# Patient Record
Sex: Female | Born: 1935 | Race: White | Hispanic: No
Health system: Southern US, Community
[De-identification: ages and names within clinical notes are randomized; demographics above are authoritative.]

## PROBLEM LIST (undated history)

## (undated) DIAGNOSIS — I10 Essential (primary) hypertension: Secondary | ICD-10-CM

## (undated) DIAGNOSIS — G309 Alzheimer's disease, unspecified: Secondary | ICD-10-CM

## (undated) DIAGNOSIS — F0280 Dementia in other diseases classified elsewhere without behavioral disturbance: Secondary | ICD-10-CM

## (undated) DIAGNOSIS — H409 Unspecified glaucoma: Secondary | ICD-10-CM

## (undated) DIAGNOSIS — F419 Anxiety disorder, unspecified: Secondary | ICD-10-CM

## (undated) DIAGNOSIS — E785 Hyperlipidemia, unspecified: Secondary | ICD-10-CM

---

## 2005-11-02 ENCOUNTER — Ambulatory Visit: Payer: Self-pay | Admitting: Internal Medicine

## 2006-11-23 ENCOUNTER — Ambulatory Visit: Payer: Self-pay | Admitting: Internal Medicine

## 2007-03-30 ENCOUNTER — Ambulatory Visit: Payer: Self-pay | Admitting: Internal Medicine

## 2007-07-28 ENCOUNTER — Ambulatory Visit: Payer: Self-pay | Admitting: Internal Medicine

## 2008-01-17 ENCOUNTER — Ambulatory Visit: Payer: Self-pay | Admitting: Internal Medicine

## 2008-10-17 ENCOUNTER — Inpatient Hospital Stay: Payer: Self-pay | Admitting: Internal Medicine

## 2010-04-16 ENCOUNTER — Ambulatory Visit: Payer: Self-pay | Admitting: Internal Medicine

## 2010-05-09 ENCOUNTER — Inpatient Hospital Stay: Payer: Self-pay | Admitting: Internal Medicine

## 2010-05-13 ENCOUNTER — Inpatient Hospital Stay: Payer: Self-pay | Admitting: Internal Medicine

## 2010-05-16 ENCOUNTER — Ambulatory Visit: Payer: Self-pay | Admitting: Internal Medicine

## 2010-06-16 ENCOUNTER — Ambulatory Visit: Payer: Self-pay | Admitting: Internal Medicine

## 2012-07-23 ENCOUNTER — Emergency Department: Payer: Self-pay | Admitting: Unknown Physician Specialty

## 2013-01-02 ENCOUNTER — Emergency Department: Payer: Self-pay | Admitting: Emergency Medicine

## 2013-02-13 ENCOUNTER — Emergency Department: Payer: Self-pay | Admitting: Emergency Medicine

## 2013-02-13 LAB — COMPREHENSIVE METABOLIC PANEL
Albumin: 3.5 g/dL (ref 3.4–5.0)
Bilirubin,Total: 0.4 mg/dL (ref 0.2–1.0)
Calcium, Total: 9 mg/dL (ref 8.5–10.1)
Chloride: 108 mmol/L — ABNORMAL HIGH (ref 98–107)
Co2: 28 mmol/L (ref 21–32)
EGFR (African American): 39 — ABNORMAL LOW
EGFR (Non-African Amer.): 34 — ABNORMAL LOW
Glucose: 88 mg/dL (ref 65–99)
Osmolality: 288 (ref 275–301)
SGOT(AST): 27 U/L (ref 15–37)
SGPT (ALT): 18 U/L (ref 12–78)
Sodium: 142 mmol/L (ref 136–145)

## 2013-02-13 LAB — CBC WITH DIFFERENTIAL/PLATELET
Basophil %: 0.7 %
HCT: 34.5 % — ABNORMAL LOW (ref 35.0–47.0)
Lymphocyte #: 1.5 10*3/uL (ref 1.0–3.6)
Lymphocyte %: 20.4 %
MCH: 31.7 pg (ref 26.0–34.0)
MCHC: 33.3 g/dL (ref 32.0–36.0)
Monocyte %: 12.5 %
Neutrophil #: 4.6 10*3/uL (ref 1.4–6.5)
Neutrophil %: 63.8 %
Platelet: 191 10*3/uL (ref 150–440)
RDW: 13.3 % (ref 11.5–14.5)

## 2013-02-13 LAB — URINALYSIS, COMPLETE
Bilirubin,UR: NEGATIVE
Blood: NEGATIVE
Glucose,UR: NEGATIVE mg/dL (ref 0–75)
Hyaline Cast: 3
Ketone: NEGATIVE
Nitrite: NEGATIVE
RBC,UR: 2 /HPF (ref 0–5)
WBC UR: 6 /HPF (ref 0–5)

## 2013-02-13 LAB — TROPONIN I: Troponin-I: <0.02 ng/mL

## 2013-03-05 LAB — URINALYSIS, COMPLETE
Bilirubin,UR: NEGATIVE
Glucose,UR: NEGATIVE mg/dL (ref 0–75)
Ketone: NEGATIVE
Leukocyte Esterase: NEGATIVE
Nitrite: NEGATIVE
Ph: 6 (ref 4.5–8.0)
Squamous Epithelial: <1

## 2013-03-05 LAB — CBC
HGB: 11.2 g/dL — ABNORMAL LOW (ref 12.0–16.0)
MCHC: 33.4 g/dL (ref 32.0–36.0)
MCV: 96 fL (ref 80–100)
Platelet: 177 10*3/uL (ref 150–440)
RDW: 13.4 % (ref 11.5–14.5)
WBC: 8.3 10*3/uL (ref 3.6–11.0)

## 2013-03-05 LAB — TROPONIN I
Troponin-I: <0.02 ng/mL
Troponin-I: <0.02 ng/mL

## 2013-03-05 LAB — COMPREHENSIVE METABOLIC PANEL
Albumin: 3.3 g/dL — ABNORMAL LOW (ref 3.4–5.0)
Alkaline Phosphatase: 96 U/L (ref 50–136)
Anion Gap: 5 — ABNORMAL LOW (ref 7–16)
Bilirubin,Total: 0.6 mg/dL (ref 0.2–1.0)
Calcium, Total: 8.7 mg/dL (ref 8.5–10.1)
Creatinine: 1.34 mg/dL — ABNORMAL HIGH (ref 0.60–1.30)
Osmolality: 280 (ref 275–301)
Potassium: 4.7 mmol/L (ref 3.5–5.1)
SGOT(AST): 44 U/L — ABNORMAL HIGH (ref 15–37)
SGPT (ALT): 20 U/L (ref 12–78)
Sodium: 138 mmol/L (ref 136–145)
Total Protein: 7 g/dL (ref 6.4–8.2)

## 2013-03-05 LAB — AMMONIA: Ammonia, Plasma: <25 mcmol/L (ref 11–32)

## 2013-03-06 ENCOUNTER — Inpatient Hospital Stay: Payer: Self-pay

## 2013-03-06 LAB — CBC WITH DIFFERENTIAL/PLATELET
Eosinophil #: 0.1 10*3/uL (ref 0.0–0.7)
Eosinophil %: 1.8 %
HCT: 33.4 % — ABNORMAL LOW (ref 35.0–47.0)
HGB: 11.2 g/dL — ABNORMAL LOW (ref 12.0–16.0)
Lymphocyte #: 0.8 10*3/uL — ABNORMAL LOW (ref 1.0–3.6)
Lymphocyte %: 11.7 %
MCH: 32.1 pg (ref 26.0–34.0)
MCHC: 33.6 g/dL (ref 32.0–36.0)
MCV: 96 fL (ref 80–100)
Monocyte #: 1 x10 3/mm — ABNORMAL HIGH (ref 0.2–0.9)
Neutrophil #: 5.3 10*3/uL (ref 1.4–6.5)
Platelet: 158 10*3/uL (ref 150–440)

## 2013-03-06 LAB — COMPREHENSIVE METABOLIC PANEL
Albumin: 3.2 g/dL — ABNORMAL LOW (ref 3.4–5.0)
Alkaline Phosphatase: 94 U/L (ref 50–136)
Bilirubin,Total: 0.4 mg/dL (ref 0.2–1.0)
Calcium, Total: 8.7 mg/dL (ref 8.5–10.1)
Chloride: 110 mmol/L — ABNORMAL HIGH (ref 98–107)
Co2: 24 mmol/L (ref 21–32)
Creatinine: 0.94 mg/dL (ref 0.60–1.30)
EGFR (African American): 59 — ABNORMAL LOW
EGFR (Non-African Amer.): 51 — ABNORMAL LOW
Glucose: 77 mg/dL (ref 65–99)
SGOT(AST): 50 U/L — ABNORMAL HIGH (ref 15–37)
SGPT (ALT): 25 U/L (ref 12–78)

## 2013-03-07 LAB — URINE CULTURE

## 2013-03-07 LAB — BASIC METABOLIC PANEL
BUN: 19 mg/dL — ABNORMAL HIGH (ref 7–18)
Co2: 23 mmol/L (ref 21–32)
Creatinine: 0.98 mg/dL (ref 0.60–1.30)
EGFR (African American): 56 — ABNORMAL LOW
EGFR (Non-African Amer.): 49 — ABNORMAL LOW
Glucose: 94 mg/dL (ref 65–99)
Osmolality: 285 (ref 275–301)
Potassium: 3.7 mmol/L (ref 3.5–5.1)

## 2013-03-11 LAB — CULTURE, BLOOD (SINGLE)

## 2014-06-16 ENCOUNTER — Ambulatory Visit: Payer: Self-pay | Admitting: Internal Medicine

## 2014-07-04 ENCOUNTER — Inpatient Hospital Stay: Payer: Self-pay | Admitting: Internal Medicine

## 2014-07-04 LAB — BASIC METABOLIC PANEL
ANION GAP: 11 (ref 7–16)
BUN: 20 mg/dL — AB (ref 7–18)
CALCIUM: 9 mg/dL (ref 8.5–10.1)
CHLORIDE: 98 mmol/L (ref 98–107)
CREATININE: 1.59 mg/dL — AB (ref 0.60–1.30)
Co2: 21 mmol/L (ref 21–32)
EGFR (African American): 31 — ABNORMAL LOW
EGFR (Non-African Amer.): 27 — ABNORMAL LOW
Glucose: 82 mg/dL (ref 65–99)
OSMOLALITY: 262 (ref 275–301)
Potassium: 4.9 mmol/L (ref 3.5–5.1)
Sodium: 130 mmol/L — ABNORMAL LOW (ref 136–145)

## 2014-07-04 LAB — CBC WITH DIFFERENTIAL/PLATELET
BASOS PCT: 0.9 %
Basophil #: 0.1 10*3/uL (ref 0.0–0.1)
EOS PCT: 3.4 %
Eosinophil #: 0.2 10*3/uL (ref 0.0–0.7)
HCT: 37 % (ref 35.0–47.0)
HGB: 12 g/dL (ref 12.0–16.0)
Lymphocyte #: 1.5 10*3/uL (ref 1.0–3.6)
Lymphocyte %: 23 %
MCH: 32.4 pg (ref 26.0–34.0)
MCHC: 32.5 g/dL (ref 32.0–36.0)
MCV: 100 fL (ref 80–100)
MONO ABS: 0.7 x10 3/mm (ref 0.2–0.9)
Monocyte %: 10.9 %
Neutrophil #: 3.9 10*3/uL (ref 1.4–6.5)
Neutrophil %: 61.8 %
Platelet: 202 10*3/uL (ref 150–440)
RBC: 3.7 10*6/uL — ABNORMAL LOW (ref 3.80–5.20)
RDW: 13 % (ref 11.5–14.5)
WBC: 6.3 10*3/uL (ref 3.6–11.0)

## 2014-07-04 LAB — URINALYSIS, COMPLETE
BLOOD: NEGATIVE
Bilirubin,UR: NEGATIVE
Glucose,UR: NEGATIVE mg/dL (ref 0–75)
KETONE: NEGATIVE
LEUKOCYTE ESTERASE: NEGATIVE
Nitrite: NEGATIVE
PH: 6 (ref 4.5–8.0)
PROTEIN: NEGATIVE
Specific Gravity: 1.005 (ref 1.003–1.030)
WBC UR: 7 /HPF (ref 0–5)

## 2014-07-04 LAB — TROPONIN I: Troponin-I: <0.02 ng/mL

## 2014-07-05 LAB — CBC WITH DIFFERENTIAL/PLATELET
Basophil #: 0.1 10*3/uL (ref 0.0–0.1)
Basophil %: 0.9 %
Eosinophil #: 0.2 10*3/uL (ref 0.0–0.7)
Eosinophil %: 2.9 %
HCT: 33.7 % — ABNORMAL LOW (ref 35.0–47.0)
HGB: 11.1 g/dL — ABNORMAL LOW (ref 12.0–16.0)
Lymphocyte #: 1.1 10*3/uL (ref 1.0–3.6)
Lymphocyte %: 17.5 %
MCH: 32.7 pg (ref 26.0–34.0)
MCHC: 32.8 g/dL (ref 32.0–36.0)
MCV: 100 fL (ref 80–100)
Monocyte #: 0.6 x10 3/mm (ref 0.2–0.9)
Monocyte %: 9.2 %
NEUTROS PCT: 69.5 %
Neutrophil #: 4.2 10*3/uL (ref 1.4–6.5)
Platelet: 203 10*3/uL (ref 150–440)
RBC: 3.38 10*6/uL — ABNORMAL LOW (ref 3.80–5.20)
RDW: 12.6 % (ref 11.5–14.5)
WBC: 6 10*3/uL (ref 3.6–11.0)

## 2014-07-05 LAB — BASIC METABOLIC PANEL
ANION GAP: 9 (ref 7–16)
BUN: 17 mg/dL (ref 7–18)
CALCIUM: 8.5 mg/dL (ref 8.5–10.1)
Chloride: 106 mmol/L (ref 98–107)
Co2: 23 mmol/L (ref 21–32)
Creatinine: 1.28 mg/dL (ref 0.60–1.30)
EGFR (Non-African Amer.): 35 — ABNORMAL LOW
GFR CALC AF AMER: 40 — AB
GLUCOSE: 75 mg/dL (ref 65–99)
OSMOLALITY: 276 (ref 275–301)
Potassium: 4 mmol/L (ref 3.5–5.1)
Sodium: 138 mmol/L (ref 136–145)

## 2014-07-07 LAB — URINE CULTURE

## 2014-07-08 ENCOUNTER — Inpatient Hospital Stay: Payer: Self-pay | Admitting: Internal Medicine

## 2014-07-08 LAB — URINALYSIS, COMPLETE
Bacteria: NONE SEEN
Bilirubin,UR: NEGATIVE
Blood: NEGATIVE
Glucose,UR: NEGATIVE mg/dL (ref 0–75)
Ketone: NEGATIVE
Leukocyte Esterase: NEGATIVE
Nitrite: NEGATIVE
Ph: 6 (ref 4.5–8.0)
Protein: 30
RBC, UR: NONE SEEN /HPF (ref 0–5)
SPECIFIC GRAVITY: 1.005 (ref 1.003–1.030)
Squamous Epithelial: 1
WBC UR: 2 /HPF (ref 0–5)

## 2014-07-08 LAB — COMPREHENSIVE METABOLIC PANEL
ANION GAP: 10 (ref 7–16)
AST: 23 U/L (ref 15–37)
Albumin: 2.9 g/dL — ABNORMAL LOW (ref 3.4–5.0)
Alkaline Phosphatase: 71 U/L
BILIRUBIN TOTAL: 0.3 mg/dL (ref 0.2–1.0)
BUN: 16 mg/dL (ref 7–18)
CALCIUM: 7.5 mg/dL — AB (ref 8.5–10.1)
CHLORIDE: 112 mmol/L — AB (ref 98–107)
Co2: 20 mmol/L — ABNORMAL LOW (ref 21–32)
Creatinine: 1.58 mg/dL — ABNORMAL HIGH (ref 0.60–1.30)
EGFR (African American): 31 — ABNORMAL LOW
EGFR (Non-African Amer.): 27 — ABNORMAL LOW
Glucose: 108 mg/dL — ABNORMAL HIGH (ref 65–99)
OSMOLALITY: 285 (ref 275–301)
Potassium: 3.5 mmol/L (ref 3.5–5.1)
SGPT (ALT): 17 U/L
SODIUM: 142 mmol/L (ref 136–145)
TOTAL PROTEIN: 5.6 g/dL — AB (ref 6.4–8.2)

## 2014-07-08 LAB — TROPONIN I: TROPONIN-I: 0.03 ng/mL

## 2014-07-08 LAB — CBC
HCT: 34.1 % — ABNORMAL LOW (ref 35.0–47.0)
HGB: 11.3 g/dL — AB (ref 12.0–16.0)
MCH: 33.2 pg (ref 26.0–34.0)
MCHC: 33.1 g/dL (ref 32.0–36.0)
MCV: 100 fL (ref 80–100)
Platelet: 250 10*3/uL (ref 150–440)
RBC: 3.4 10*6/uL — AB (ref 3.80–5.20)
RDW: 13.1 % (ref 11.5–14.5)
WBC: 10.5 10*3/uL (ref 3.6–11.0)

## 2014-07-09 LAB — CBC WITH DIFFERENTIAL/PLATELET
Basophil #: 0.1 10*3/uL (ref 0.0–0.1)
Basophil %: 0.6 %
Eosinophil #: 0.3 10*3/uL (ref 0.0–0.7)
Eosinophil %: 2.5 %
HCT: 31 % — AB (ref 35.0–47.0)
HGB: 10.1 g/dL — AB (ref 12.0–16.0)
LYMPHS PCT: 10.4 %
Lymphocyte #: 1.1 10*3/uL (ref 1.0–3.6)
MCH: 32.6 pg (ref 26.0–34.0)
MCHC: 32.6 g/dL (ref 32.0–36.0)
MCV: 100 fL (ref 80–100)
Monocyte #: 0.7 x10 3/mm (ref 0.2–0.9)
Monocyte %: 6.8 %
NEUTROS ABS: 8.7 10*3/uL — AB (ref 1.4–6.5)
Neutrophil %: 79.7 %
Platelet: 166 10*3/uL (ref 150–440)
RBC: 3.1 10*6/uL — ABNORMAL LOW (ref 3.80–5.20)
RDW: 13.2 % (ref 11.5–14.5)
WBC: 10.9 10*3/uL (ref 3.6–11.0)

## 2014-07-09 LAB — BASIC METABOLIC PANEL
Anion Gap: 9 (ref 7–16)
BUN: 15 mg/dL (ref 7–18)
CREATININE: 0.97 mg/dL (ref 0.60–1.30)
Calcium, Total: 8.1 mg/dL — ABNORMAL LOW (ref 8.5–10.1)
Chloride: 114 mmol/L — ABNORMAL HIGH (ref 98–107)
Co2: 21 mmol/L (ref 21–32)
EGFR (African American): 56 — ABNORMAL LOW
GFR CALC NON AF AMER: 49 — AB
Glucose: 93 mg/dL (ref 65–99)
Osmolality: 287 (ref 275–301)
Potassium: 3.4 mmol/L — ABNORMAL LOW (ref 3.5–5.1)
Sodium: 144 mmol/L (ref 136–145)

## 2014-07-09 LAB — CULTURE, BLOOD (SINGLE)

## 2014-07-09 LAB — MAGNESIUM: MAGNESIUM: 1 mg/dL — AB

## 2014-07-10 LAB — MAGNESIUM: Magnesium: 2.4 mg/dL

## 2014-07-13 LAB — CULTURE, BLOOD (SINGLE)

## 2014-07-17 ENCOUNTER — Ambulatory Visit: Payer: Self-pay | Admitting: Internal Medicine

## 2014-07-25 ENCOUNTER — Emergency Department: Payer: Self-pay | Admitting: Emergency Medicine

## 2014-09-25 ENCOUNTER — Emergency Department: Payer: Self-pay | Admitting: Emergency Medicine

## 2014-09-25 LAB — CBC
HCT: 30.9 % — AB (ref 35.0–47.0)
HGB: 10.2 g/dL — AB (ref 12.0–16.0)
MCH: 32.4 pg (ref 26.0–34.0)
MCHC: 32.9 g/dL (ref 32.0–36.0)
MCV: 99 fL (ref 80–100)
Platelet: 194 10*3/uL (ref 150–440)
RBC: 3.14 10*6/uL — ABNORMAL LOW (ref 3.80–5.20)
RDW: 13.2 % (ref 11.5–14.5)
WBC: 7.6 10*3/uL (ref 3.6–11.0)

## 2014-09-25 LAB — COMPREHENSIVE METABOLIC PANEL
ALT: 14 U/L
ANION GAP: 7 (ref 7–16)
Albumin: 3.2 g/dL — ABNORMAL LOW (ref 3.4–5.0)
Alkaline Phosphatase: 97 U/L
BUN: 31 mg/dL — AB (ref 7–18)
Bilirubin,Total: 0.4 mg/dL (ref 0.2–1.0)
CO2: 28 mmol/L (ref 21–32)
Calcium, Total: 8.5 mg/dL (ref 8.5–10.1)
Chloride: 103 mmol/L (ref 98–107)
Creatinine: 1.38 mg/dL — ABNORMAL HIGH (ref 0.60–1.30)
EGFR (African American): 45 — ABNORMAL LOW
EGFR (Non-African Amer.): 38 — ABNORMAL LOW
Glucose: 86 mg/dL (ref 65–99)
Osmolality: 282 (ref 275–301)
Potassium: 4.5 mmol/L (ref 3.5–5.1)
SGOT(AST): 16 U/L (ref 15–37)
Sodium: 138 mmol/L (ref 136–145)
Total Protein: 6.4 g/dL (ref 6.4–8.2)

## 2014-09-25 LAB — URINALYSIS, COMPLETE
Bacteria: NONE SEEN
Bilirubin,UR: NEGATIVE
Blood: NEGATIVE
GLUCOSE, UR: NEGATIVE mg/dL (ref 0–75)
Leukocyte Esterase: NEGATIVE
NITRITE: NEGATIVE
Ph: 5 (ref 4.5–8.0)
RBC,UR: <1 /HPF (ref 0–5)
Specific Gravity: 1.017 (ref 1.003–1.030)
WBC UR: 1 /HPF (ref 0–5)

## 2014-09-25 LAB — TROPONIN I

## 2015-03-08 NOTE — Discharge Summary (Signed)
PATIENT NAME:  [name] [name], [name] [name] MR#:  [id number] DATE OF BIRTH:  17-Jul-1914  DATE OF ADMISSION:  03/06/2013 DATE OF DISCHARGE: 03/09/2013    PRIMARY CARE PHYSICIAN: Stann Mainlandavid P. Sampson GoonFitzgerald, MD  CONSULTING PSYCHIATRIST: Ardeen FillersUzma S. Garnetta BuddyFaheem, MD  DISCHARGE DIAGNOSES:  1. Altered mental status likely due to toxic medication-induced encephalopathy.  2. Hypertension.  3. Sinusitis.   HISTORY OF PRESENT ILLNESS: Please see admission history and physical. Briefly, this is a pleasant 81 year old with moderate dementia who was brought in due to increasing confusion and difficulty to arouse.   HOSPITAL COURSE BY ISSUE:  1. Altered mental status. This was felt likely to be due to clonazepam. This was held. The patient was given gentle IV fluids. Workup including a UA, urine culture, blood culture, labs including a normal white count, normal creatinine and chest x-ray and a CT of the head without contrast were all negative. She slowly improved. Psychiatry saw her and recommended that in the setting of sundowning and some agitation, Haldol orally would be appropriate. She remained stable but did exhibit sundowning in the evening.  2. Evidence of sinusitis. This was noted on CT scan of the head. She was treated with ceftriaxone and will be discharged on Augmentin for 3 more days.  3. Renal insufficiency. She had some increased creatinine at admission, but this improved at baseline.  4. Hypertension. Blood pressure was relatively well controlled with metoprolol and Norvasc.  5. Mild elevation in LFTs. This has resolved and without symptoms.   DISCHARGE MEDICATIONS:  1. Amlodipine 5 mg once a day.  2. Cerovite once a day.  3. Clopidogrel 75 mg once a day.  4. Tamsulosin 0.4 mg once a day.  5. Brimonidine eyedrops 1 drop 2 times a day.  6. Dorzolamide/timolol eyedrops 1 drop to each eye 2 times a day.  7. Petroleum topical ointment as needed 2 times a day.  8. Metoprolol 25 mg twice a day.  9. Latanoprost 1 drop to  each eye at bedtime.  10. Bisac-Evac 10 mg once a day.  11. Bisacodyl 5 mg 2 tablets orally as needed.  12. Tylenol 325 two tablets every 6 hours p.r.n. pain.  13. Milk of Magnesia 30 mL q.12 hours as needed for constipation.  14. Iron sulfate 45 mg extended-release once a day.  15. Amoxicillin/clavulanic acid 500/125 one tablet every 8 hours for 3 days.  16. Haldol 2 mg/mL concentrate give 0.5 mL orally every 3 to 4 hours as needed for agitation.  The patient is to stop clonazepam and no longer take it as this was likely contributing to her confusion.   DISCHARGE DIET: Mechanical soft, low salt.   [facility]   HOME OXYGEN: No.    ACTIVITY LIMITATIONS: As tolerated. She will need physical therapy.   FOLLOWUP: The patient will follow up with Dr. Sampson GoonFitzgerald in the office in 1 to 2 weeks.  TIME SPENT: This discharge took 35 minutes.   ____________________________ Stann Mainlandavid P. Sampson GoonFitzgerald, MD dpf:OSi D: 03/09/2013 08:05:32 ET T: 03/09/2013 08:17:45 ET JOB#: [id number]  cc: Stann Mainlandavid P. Sampson GoonFitzgerald, MD, <Dictator> Wyndi Northrup Sampson GoonFITZGERALD MD ELECTRONICALLY SIGNED 03/15/2013 12:09

## 2015-03-08 NOTE — H&P (Signed)
PATIENT NAME:  KHYLER, URDA MR#:  [id number] DATE OF BIRTH:  07-26-1914  DATE OF ADMISSION:  03/05/2013  PRIMARY CARE PHYSICIAN:  Dr. Sampson Goon.   HISTORY OF PRESENT ILLNESS:  The patient is a 81 year old Caucasian female with past medical history significant for history of stroke with no significant residual in 2010, history of hyperlipidemia, glaucoma, dementia, sick sinus syndrome, status post pacemaker placement, hypertension, anxiety, who presents to the hospital after she was found to be confused and difficult to arouse. Apparently, the patient was diagnosed with urinary tract infection recently. She was started on ciprofloxacin and finished it in the morning on February 20, 2013. Post  ciprofloxacin therapy she remained somewhat confused, however, she did relatively well 2 days ago, on Friday, when the family saw her. She was less confused and she was doing comparatively well. She lives in an assisted living facility where, even though wheelchair-bound, she is able to dress herself, she is able to get up, go to the bathroom and then feed herself. Apparently, she got up today in the morning as she usually does very early in the morning. She dressed herself up and took her medications.  However, later in the day, the patient was found to be poorly responsive and was brought to the Emergency Room for further evaluation. In the Emergency Room, she is easy to wake up with verbal stimuli. She is, however, very confused. She would not answer questions appropriately but able to verbalize and her speech is intact. Since she is not getting better, even with rehydration with IV fluids and no significant abnormalities such as infection were found during evaluation in the Emergency Room, hospitalist services were contacted for admission. Apparently, the assisted living facility called the patient's family and reported that the patient was drooling from her mouth and leaning to the left side and not responding well.  That  is the reason she was brought to Emergency Room.    PAST MEDICAL HISTORY: Significant for history of stroke ion 2010 which did not leave her with any residual, glaucoma, history of hyperlipidemia, Alzheimer's dementia, sick sinus syndrome, status post pacemaker placement, hypertension, anxiety, syncope, questionable TIA in the past, history of orthostatic hypotension, urinary retention.   MEDICATIONS:  1.  Norvasc 5 mg p.o. daily.  2.  Cerovite antioxidant 18/0.4 mg once a day.  3.  Plavix 75 mg p.o. daily.  4.  Iron sulfate 140 mg once daily.  5.  Tamsulosin 0.1 mg p.o. daily.  6.  (DICTATION ANOMALY) Brimonidine 0.2%, 1 drop twice daily to both eyes.  7.  Clonazepam 0.25 mg p.o. twice daily.   8.  Dorzolamide/timolol to both eyes twice a day.  9.  Metoprolol 25 mg p.o. twice daily.  10.  Dulcolax 10 mg suppository once daily as needed.  11.  Dulcolax tablets 10 mg daily as needed.   12.  Tylenol 650 mg p.o. every 6 hours as needed.  13.  Milk of magnesia 30 mL twice a day.    PAST SURGICAL HISTORY: Significant for hysterectomy.   SOCIAL HISTORY: Resident of [facility] assisted living facility.  No smoking, alcohol or drug abuse.   FAMILY HISTORY: The patient's mother died at 31 of CVA and hypertension. The patient's father died of rheumatological issues at the age of 94 from prior records as the patient's family does not remember much about her history.    REVIEW OF SYSTEMS: Not obtainable as the patient is very confused.   PHYSICAL EXAMINATION: VITAL SIGNS:  On arrival to the hospital, temperature is 97.3, pulse 69, respiratory rate was 18, blood pressure 133/64, saturation was 95% on room air.  GENERAL: This is a well-nourished, thin, pale with sunken eyes Caucasian female, elderly female laying on the stretcher.  HEENT: Pupils are equal, reactive to light, extraocular movements intact.  There is no conjunctivitis. The patient does have some difficulty hearing. No pharyngeal  erythema. Mucosa is very dry.  NECK: No masses. Supple, nontender. Thyroid is not enlarged. No adenopathy. No JVD or carotid bruits bilaterally. Full range of motion.  LUNGS: Clear to auscultation anteriorly, however, a few crackles as well as rhonchi were heard on the left side, a few rhonchi were heard on the right anteriorly. No wheezing noted. The patient has increased effort to breathe. No dullness to percussion. Not in overt respiratory distress.  CARDIOVASCULAR: S1, S2 appreciated. Rhythm is regular. PMI not lateralized.  CHEST: Nontender to palpation.  EXTREMITIES: Pedal pulses 1+. No lower extremity edema.  No tenderness or cyanosis was noted.  ABDOMEN: Soft, nontender. Bowel sounds are present. No hepatosplenomegaly or masses were noted.  RECTAL: Deferred.  MUSCLE STRENGTH: Not able to assess as the patient is not moving much even with verbal or painful stimuli.  SKIN:  Did not reveal any rashes, lesions, erythema, nodularity or induration. It was warm and dry to palpation.  LYMPH: No adenopathy in the cervical region.  NEUROLOGIC: Cranial nerves grossly intact. Not able to assess sensory, no dysarthria or aphasia. However, the patient is confused and talking, not answering appropriately to questions.  PSYCHIATRIC:  The patient is very somnolent. She ops her eyes as well as able to converse briefly but she is very confused. She is not oriented. She is poorly cooperative. No agitation or depression were noted.   LABORATORY DATA: BMP showed BUN and creatinine were 24 and 1.34 and that is about the same as in March 2014.  The patient's liver enzymes revealed an albumin level of 3.3, AST was elevated to 44 which is a littler higher than it was in the past. Troponin was normal at less than 0.02. White blood cell count was normal at 8.3, hemoglobin was 11.2 which is about the same as her prior hemoglobin levels and platelet count 177. Urinalysis yellow, hazy, negative for glucose, bilirubin or  ketones, specific gravity 1.015, pH was 6.0, negative for blood, 100 mg/dl of protein, negative for nitrites as well as leukocyte esterase, 1 red blood cells as well as  white blood cell, no bacteria was seen; less than 1 epithelial cell was found, mucus was present. EKG showed electronic ventricular pacemaker at 70 beats per minute   RADIOLOGIC STUDIES: CT scan of head without contrast on March 05, 2013, showed bilateral ethmoid as well as left sphenoid sinusitis, (Dictation Anomaly) correlate clinically, atrophy with chronic microvascular ischemic disease. No definite acute intracranial abnormality was seen. Chest x-ray is pending.   ASSESSMENT AND PLAN: 1.  Altered mental status. Admit the patient to the medical floor. We will hold the patient's clonazepam and will follow the patient neurologically every 4 hours. We will get cultures such as blood cultures as well as urine culture. Will also get chest x-ray and we will start Rocephin for  questionable acute sinusitis.  2.  Hypertension. Will get bedside swallowing test done and we will continue metoprolol as well as Norvasc if she passes this test.  We will start her on nitroglycerin topically if she fails swallowing testing.  3.  Renal insufficiency, seems  to be chronic. Urinalysis is within normal. We will get urine cultures; however.  4.  Elevated transaminases. We will get ammonia level. Will repeat liver testing in the morning. We will get ultrasound of abdomen if the patient liver enzymes remain abnormal.  5.  History of stroke, will continue Plavix.  6.  History of urinary Retention. We will continue tamsulosin.  7.  History of anxiety. We will hold the patient's clonazepam just for a short period of time.   TIME SPENT: 40 minutes.   ____________________________ Katharina Caper, MD rv:cs D: 03/05/2013 14:51:12 ET T: 03/05/2013 15:22:47 ET JOB#: [id number]  cc: Katharina Caper, MD, <Dictator> Stann Mainland. Sampson Goon, MD Katharina Caper  MD ELECTRONICALLY SIGNED 04/12/2013 13:19

## 2015-03-08 NOTE — Consult Note (Signed)
PATIENT NAME:  [name] [name], [name] [name] MR#:  [id number] DATE OF BIRTH:  20-Nov-1913  DATE OF CONSULTATION:  03/07/2013  REFERRING PHYSICIAN:   CONSULTING PHYSICIAN:  Abagale Boulos S. Garnetta Buddy, MD  REFERRING PHYSICIAN: Clydie Braun, M.D.   REASON FOR CONSULTATION: Dementia with agitation.   HISTORY OF PRESENT ILLNESS: The patient is a 81 year old Caucasian female with a history significant for dementia, stroke with no significant residual in 2010, hyperlipidemia, sick sinus syndrome and status post pacemaker placement.  She presented to the Emergency Room because of inability to take care of herself. The patient was diagnosed with urinary tract infection recently and was started on ciprofloxacin and completed that course on April 7th. After she had completed the course, she remained confused and was living in an assisted living facility where, even though she wheelchair bound, she was able to dress herself and be able to take care of her ADLs.  Apparently, when she got up in the morning, she and was found to be poorly responsive and was brought to the Emergency Room for further evaluation and treatment. In the Emergency Room, she is able to wake up with verbal stimuli but appeared to be confused. She did not answer questions appropriately but was able to verbalize and her speech was intact. She was not getting better with rehydration and IV fluids. No significant abnormalities were found during evaluation including any acute infections. Apparently, the assisted living facility called the patient's family and reported that the patient was drooling from her mouth and leaning to the left side and had some poor response.   During my interview, the patient was noted to be lying in the bed. She appeared calm and pleasantly confused. However, she told me that she has a son who lives in [place]. She also told me that she has an 48-year-old grandchild. She stated that she lives in an assisted living facility and she does not have  any problems over there. The patient reported that she lives in the [place] area. She was  able to respond to most of the questions. She stated that she takes her medications as prescribed. She was able to answer most of the questions in short sentences. She does not appear to be hallucinating at this time. Her nursing staff reported that the patient was very agitated earlier in the morning but now she had started to respond to the medications and she is becoming more calm and cooperative.   PAST MEDICAL HISTORY:  1.  The patient has a history of stroke in 2010, which has not caused any residual effect on her.  2.  History of glaucoma.  3.  Hyperlipidemia.  4.  History of Alzheimer's dementia.  5.  Sick sinus syndrome.  6.  Status post pacemaker placement.  7.  Hypertension.  8.  Anxiety.  9.  Syncope.  10.  Questionable TIA in the past.   11.  History of orthostatic hypertension.  12.  Urinary retention.   CURRENT MEDICATIONS: Norvasc 5 mg p.o. daily, Cerovite antioxidant 18/0.4 mg daily, Plavix 75 mg daily, iron sulfate 140 mg daily, tamsulosin 0.1 mg daily, clonazepam 0.25 mg p.o. b.i.d., dorzolamide/timolol to both eyes twice a day, metoprolol 25 mg b.i.d., Dulcolax 10 mg once daily, Dulcolax tablets 10 mg once daily, Tylenol 650 mg p.o. q.6 hours p.r.n., milk of magnesia 30 mL twice a day.   PAST SURGICAL HISTORY: Hysterectomy.   SOCIAL HISTORY: The patient currently lives in [facility] assisted living facility. She does not have any  history of illicit drug use including smoking.   FAMILY HISTORY: Her mother passed away at the age of 79 due to CVA and hypertension. The patient's father passed away due to rheumatological issues at the age of 79.   PAST PSYCHIATRIC HISTORY: The patient had an alcoholic husband who made her life difficult although she was able to take care of her son and her husband while working. The family did not notice any radical changes in her mood, behavior  and cognition.   ALLERGIES:  IRON WHICH CAUSES DIGESTIVE DISTRESS.   REVIEW OF SYSTEMS: The patient currently denied having any fever, chills or recent weight changes. She denied having any headaches or blurred vision. No tinnitus, earache or sore throat. No cough or wheezing. Denies chest pain or palpitations. Denies nausea, vomiting, diarrhea or abdominal pain. No dysuria or hematuria. No heat or cold intolerance, numbness or tingling. There is some weakness noted.   PHYSICAL EXAMINATION: VITAL SIGNS: Temperature 97.3, pulse 65, respirations 18, blood pressure 145/56.   LABORATORY DATA: Glucose 94, BUN 19, creatinine 0.98, sodium 142, potassium 3.7, chloride 112, bicarbonate 23, GFR 56, anion gap 7, osmolality 285, protein 6.8, albumin 3.2, alkaline phosphatase 94, AST 50, ALT 25. WBC 7.3, RBCs 3.5, hemoglobin 11.2, hematocrit 33.4, platelet count 158, MCV 96, MCH 32.1, neutrophils 72.7.   MENTAL STATUS EXAMINATION: The patient is a moderately-built female who was lying on the bed. She was alert and oriented to person and vaguely to the situation. She was not irritable and appeared calm and cooperative. Her thought process was logical and goal-directed at times. She was noted to be confused at times during the interview. She denied having any suicidal or homicidal ideations or plans. She showed some memory deficit during the interview. Her insight and judgment were poor.   CURRENT ASSESSMENT: The patient is a 81 year old female with history of dementia who became agitated during her presentation to the Emergency Room. However, she is currently becoming more calm after her clonazepam was stopped.   AXIS I:  Multifactorial delirium which might be related to her medications and medical issues.  AXIS II: None.  AXIS III: Please review the medical history.   TREATMENT PLAN:  I have reviewed her records and the patient has a history of prior response to Haldol liquid at a very low dose. I will restart  her back on low-dose Haldol liquid 1 mg p.o. q.4 to 6 hours p.r.n. for agitation. She will continue to be monitored closely by the staff for worsening of her cardiac condition and history of CVA.  Will also need to be monitored closely if she will have any need for anxiety medication in the long run as I do not think that clonazepam is the right choice of medication for this patient. Once she becomes clinically stable, the Haldol dose can be tapered out slowly for this patient.   Thank you for allowing me to participate in the care of this patient.   ____________________________ Ardeen FillersUzma S. Garnetta BuddyFaheem, MD usf:cs D: 03/07/2013 16:36:00 ET T: 03/07/2013 18:14:14 ET JOB#: [id number]  cc: Ardeen FillersUzma S. Garnetta BuddyFaheem, MD, <Dictator> Rhunette CroftUZMA S Dakia Schifano MD ELECTRONICALLY SIGNED 03/12/2013 11:47

## 2015-03-09 NOTE — H&P (Signed)
PATIENT NAME:  [name] [name], [name] [name] MR#:  [id number] DATE OF BIRTH:  October 10, 1914  DATE OF ADMISSION:  07/08/2014  REFERRING PHYSICIAN: Dr. Lucrezia EuropeAllison Webster.  PRIMARY CARE PHYSICIAN: Dr. Sampson GoonFitzgerald.   CHIEF COMPLAINT: Altered mental status.   HISTORY OF PRESENT ILLNESS: This is a 81 year old female with history of hypertension, hyperlipidemia, dementia, history of CVA who is status post pacemaker in the past, sick sinus syndrome, brought from a nursing home from EMS for altered mental status. The patient was recently hospitalized and discharged on August 21st due to altered mental status related to UTI. The patient was discharged back to the nursing home on Ceftin. I spoke with daughter who reports the patient has poor mentation at baseline, ambulates with a wheelchair usually but over the last few weeks she has a progressive weakness and lethargy and decreased p.o. intake. The patient was discharged with improved mental status. The daughter reports she was with her mom at the nursing home and reports she was sleepy initially, but then she perked up by the end of the day where she did have supper and went to sleep. This is when she left. The patient was lethargic, to the nursing staff facility where they called EMS for her. The patient was started on Seroquel upon discharge for insomnia, which she did take at the nursing home. Upon presentation, the patient has mild hypoxia and hypotension, which her hypotension did respond to IV fluids. The patient will work-up was significant for mild elevation of her creatinine at 1.58. Chest x-ray did not have any acute findings. Urinalysis was negative for. CT head did not show any acute findings. Hospitalist requested to admit the patient.   PAST MEDICAL HISTORY:  1. History of CVA in the past without residual deficits.  2. Glaucoma.  3. Hyperlipidemia.  4. Alzheimer's dementia.  5. Sick sinus syndrome. 6. Status post pacemaker placement.  7. Hypertension.  8. Anxiety.   9. Syncope.  10. History of orthostatic hypotension.   PAST SURGICAL HISTORY: Hysterectomy.   ALLERGIES: No known drug allergies.   HOME MEDICATIONS: The patient was discharged on these medications: 2. Plavix 75 mg daily. 3. Flomax 0.4 mg daily. 4. Brimonidine 0.2% one drop each eye [name].i.d.  5. Dorzolamide/timolol one drop [name].i.d.  6. Latanoprost 0.005% one drop each eye at bedtime.  7. Bisac-Evac 10 mg rectal suppository daily as needed.  8. Milk of magnesia every 12 hours as needed.  9. Iron sulfate 45 mg daily.  10. Naproxen 220 mg oral daily.  11. Norvasc 2.5 mg oral daily.  12. Voltaren topical applied to affected area at bedtime.  13. Tylenol as needed.  14. Seroquel 12.5 mg oral at bedtime.  15. Ceftin 500 mg oral p.o. [name].i.d. for a total of 5 days which supposed to be started on August 21, that leaves 3 days left.   SOCIAL HISTORY: As per previous records. The patient is a nursing home resident. No history of smoking, alcohol, or drug use.   FAMILY HISTORY: As per previous medical records significant for CVA and hypertension.   REVIEW OF SYSTEMS: Unable to be obtained from this patient secondary to her altered mental status.   PHYSICAL EXAMINATION:   VITAL SIGNS: Temperature 97.9, pulse 66, respiratory rate 18, blood pressure 92/76, saturating 95% on oxygen.  GENERAL: Frail, elderly female who, looks comfortable and in bed, in no apparent distress.  HEENT: Head atraumatic, normocephalic. Pupils equally reactive to light. Pink conjunctivae. Anicteric sclerae. Moist oral mucosa. No oral thrush.  NECK: Supple. No thyromegaly. No JVD. No carotid bruits. No lymphadenopathy.  CHEST: Good air entry bilaterally. No wheezing, rales, rhonchi.  CARDIOVASCULAR: S1, S2 heard. No rubs, murmur or gallops. PMI nondisplaced.  ABDOMEN: Soft, nontender, nondistended. Bowel sounds present. No hepatosplenomegaly.  EXTREMITIES: No edema. No clubbing. No cyanosis. Pedal pulses +2 bilaterally.   SKIN: Normal skin turgor. Warm and dry.  MUSCULOSKELETAL: Could not evaluate secondary to altered mental status.  PSYCHIATRIC: The patient is lethargic, mumbles a few words in response to loud verbal stimuli, but incoherent.  NEUROLOGIC: Cranial nerves appear to be grossly intact. Motor not able to examine appropriately secondary to her mental status, but appears to be moving upper extremity without deficits, but appears to be generally weak.  MUSCULOSKELETAL: No joint effusion or erythema.   PERTINENT LABORATORY DATA: Glucose 108, BUN 16, creatinine 1.58, sodium 142, potassium 3.5, chloride 112, CO2 20, troponin 0.03. White blood cells 10.5, hemoglobin 11.3, hematocrit 34.1, platelets 252,000. Urinalysis negative for leukocyte esterase and nitrate. Chest x-ray showing no active cardiopulmonary disease. CT head: No acute intracranial findings and chronic intracranial ischemic injuries. pCO2 41 and lactic acid 0.9.   ASSESSMENT AND PLAN:  1. Altered mental status, this is most likely related to Seroquel. The patient does not have an acute finding on the CT head, she appears to be mildly dehydrated. We will start her on gentle hydration. She has a mentation at baseline as per daughter, so we will hold her Seroquel. We will keep her n.p.o. and we will re-evaluate.  2. History of urinary tract infection. As the patient is currently n.p.o. we will switch her Ceftin to IV levofloxacin until she is able to take p.o. intake.  3. History of hypertension, dementia. We will hold her medications until she is able to take p.o.  4. History of glaucoma. Continue with her eyedrops at home. 5. Deep venous thrombosis prophylaxis. Subcutaneous heparin.   CODE STATUS: The patient has DO NOT RESUSCITATE form. Discussed the case with her daughter, who confirms the patient is DO NOT RESUSCITATE,   TIME SPENT ON ADMISSION AND PATIENT CARE: 50 minutes.    ____________________________ Starleen Arms,  MD dse:JT D: 07/08/2014 02:47:47 ET T: 07/08/2014 03:59:46 ET JOB#: [id number]  cc: Starleen Arms, MD, <Dictator> Pepe Mineau Teena Irani MD ELECTRONICALLY SIGNED 07/09/2014 6:47

## 2015-03-09 NOTE — H&P (Signed)
PATIENT NAME:  [name] [name], [name] [name] MR#:  [id number] DATE OF BIRTH:  Jun 08, 1914  DATE OF ADMISSION:  07/04/2014  PRIMARY CARE PHYSICIAN: Roe Coombson C. Candelaria Stagershaplin, MD  CHIEF COMPLAINT: Altered mental status.   HISTORY OF PRESENT ILLNESS: [name] [name] is a 81 year old female with a history of hypertension, hyperlipidemia, dementia, previous history of CVA, status post pacemaker placement, who is brought to the Emergency Department by the EMS after being for altered mental status. The patient has not been eating and drinking for the last 1 week, refusing all her medications, which is unusual for her baseline. We are unable to obtain any history from the patient. The history is mainly obtained from the  and the previous medical records. The patient is extremely hard of hearing. Workup in the Emergency Department: Mild urinary tract infection. No elevated white blood cell count. The patient is also found to have elevated BUN and creatinine, significantly worsened from the baseline. The patient is having elevated high blood pressure, systolic blood pressure in the 180s; it could be from the not taking her blood pressure medication. The patient has been afebrile in the Emergency Department. Chest x-ray, CT head without contrast were unremarkable. The patient is also noted to have mild hyponatremia of 130.   PAST MEDICAL HISTORY:  1.  History of CVA in 2010 without any residual weakness.  2.  Glaucoma.  3.  Hyperlipidemia.  4.  Alzheimer dementia.  5.  Sick sinus syndrome.  6.  Status post pacemaker placement.  7.  Hypertension.  8.  Anxiety.  9.  Syncope. 10.  History of orthostatic hypotension.   PAST SURGICAL HISTORY: Hysterectomy.   ALLERGIES: No known drug allergies.   HOME MEDICATIONS:  1.  Voltaren topical as needed.  2.  Tamsulosin 0.4 mg once a day.  3.  Naprosyn 220 mg once a day.  4.  Milk of magnesia as needed.  5.  Tylenol as needed.  6.  Latanoprost once at bedtime.  7.  Ferrous sulfate once a day.   8.  Dorzolamide timolol eyedrops 2 times a day.  9.  Plavix 75 mg once a day.  10.  Multivitamin once a day.  11.  Brimonidine 2 times a day.  12.  Bisacodyl 5 mg 2 tablets as needed.  13.  Bisac-Evac suppository as needed.  14.  Amlodipine 2.5 mg once a day.   SOCIAL HISTORY: As per previous records. Resident of [facility]. No history of smoking, alcohol, or drug use.   FAMILY HISTORY: Per records, mother died at the age of 79 from CVA and hypertension. The patient's father died of rheumatologic issues at the age of 79.   REVIEW OF SYSTEMS: Unable to obtain as the patient has altered mental status and very hard of hearing.   PHYSICAL EXAMINATION:  GENERAL: This is a well-built, well-nourished female lying down in the bed, not in distress.  VITAL SIGNS: Temperature 97.5, pulse 77, blood pressure 183/75, respiratory rate of 16, oxygen saturation is 97% on room air.  HEENT: Head normocephalic, atraumatic. There is no scleral icterus. Conjunctivae normal. Pupils equal, round, react to light. Mucous membranes mild redness.  NECK: Supple. No lymphadenopathy. No JVD. No carotid bruit.  CHEST: Has no focal tenderness.  LUNGS: Bilaterally clear to auscultation.  HEART: S1, S2 regular. No murmurs are heard.  ABDOMEN: Bowel sounds present. Soft, nontender, nondistended. No hepatosplenomegaly.  EXTREMITIES: No pedal edema. Pulses 2+.  SKIN: No rash or lesions.  MUSCULOSKELETAL: Could not examine, as patient  was not cooperative. NEUROLOGIC: Patient is able to tell her name. Not oriented to place, person, and time. No apparent cranial nerve abnormalities. Could not examine the motor and sensory.   LABORATORIES: BMP: BUN 20, creatinine of 1.59, sodium of 130. The rest of all the values are within normal limits.   CBC is completely within normal limits.   UA: 2+ bacteria. WBC of 7. The rest of all the values are within normal limits.   CT, head, without contrast: No acute intracranial  abnormality. Stable atrophy. Chronic white matter disease. Left old parietal stroke.   ASSESSMENT AND PLAN: [name] [name] is a 81 year old female who is brought to the Emergency Department with altered mental status.  1.  Altered mental status most likely from the dehydration. Mild urinary tract infection. This is unlikely to cause any altered mental status; however, will obtain urine cultures, keep the patient on Rocephin, continue with intravenous fluids, and follow up. The patient also has elevated blood pressure, which also could be causing hypertensive encephalopathy. The patient has baseline dementia which could be worsening.  2.  Hyponatremia, most likely secondary to dehydration. Continue with intravenous fluids and follow up.  3.  Urinary tract infection. Obtain urine cultures. Keep the patient on Rocephin. Mild infection.  4.  Hypertension, poorly controlled. Keep the patient on home medications of Norvasc.  5.  History of glaucoma. Continue with home medications of eyedrops.  6.  Dementia. Expect it to be worsening. The patient is do not resuscitate and do not intubate.  7.  Debility. We will involve physical therapy; however, we do not have the patient's baseline functional status.  8.  Keep the patient on deep vein thrombosis prophylaxis with Lovenox.   TIME SPENT: 50 minutes.    ____________________________ Susa Griffins, MD pv:ST D: 07/04/2014 21:07:38 ET T: 07/04/2014 21:35:53 ET JOB#: [id number]  cc: Susa Griffins, MD, <Dictator> Susa Griffins MD ELECTRONICALLY SIGNED 07/07/2014 0:52

## 2015-03-09 NOTE — Discharge Summary (Signed)
PATIENT NAME:  [name] [name], Shardea B MR#:  [id number] DATE OF BIRTH:  10/19/1914  DATE OF ADMISSION:  07/08/2014 DATE OF DISCHARGE:  07/10/2014  ADMISSION DIAGNOSIS: Altered mental status.   DISCHARGE DIAGNOSES:  1.  Acute encephalopathy due to urinary tract infection, Seroquel, dehydration and likely  progressive dementia.  2.  Dehydration.  3.  Streptococcus viridans urinary tract infection from previous admission.  4.  Hypertension.  5.  Dementia.   CONSULTATIONS: Palliative care.  HOSPITAL COURSE: A 81 year old female who was discharged and then subsequently presented 2 days later with altered mental status, found to have acute encephalopathy. For further details please refer to H and P.  1.  Acute encephalopathy due to a urinary tract infection, Seroquel and dehydration with likely progressive dementia. The patient's symptoms have actually improved. We stopped Seroquel.   2.  Dehydration. Improved with IV fluids. 3.  Streptococcus viridans urinary tract infection from previous admission. The patient will resume her Ceftin at discharge.  4.  Hypertension, controlled on low dose Norvasc.  5.  Dementia.  Palliative care was consulted due to her worsening dementia. She did have hospice screening and will be discharged to assisted living with hospice.   DISCHARGE MEDICATIONS:  1.  Theravite 1 tablet daily.  2.  Plavix 75 mg daily.  3.  Tamsulosin 0.4 mg daily.  4.  Brimonidine ophthalmic solution 0.2% 1 drop b.i.d.  5.  Dorzolamide/timolol 1 drop b.i.d.  6.  Latanoprost 0.005% 1 drop each eye.  7.  Bisac-Evac 10 mg, 30 mL b.i.d. p.r.n.  8.  Ferrous sulfate 45 mg daily.  9.  Naproxen 220 mg daily.  10. Norvasc 2.5 daily.  11. Voltaren topical gel, apply to affected area daily.  12. Tylenol 325 mg 2 tablets q. 6 hours p.r.n.  13. Ceftin 500 mg b.i.d. for 5 days.   DISCHARGE DIET: Regular pureed, nectar thick liquids, no straws, strict aspiration precautions.   DISCHARGE ACTIVITY: As  tolerated.   DISCHARGE REFERRAL: Physical therapy.   The patient was stable for discharge.   TIME SPENT: Approximately 35 minutes.    ____________________________ Janyth ContesSital P. Juliene PinaMody, MD spm:lt D: 07/10/2014 14:12:00 ET T: 07/10/2014 15:01:31 ET JOB#: [id number]  cc: Alexxus Sobh P. Juliene PinaMody, MD, <Dictator> Janyth ContesSITAL P Keavon Sensing MD ELECTRONICALLY SIGNED 07/11/2014 10:27

## 2015-03-09 NOTE — Discharge Summary (Signed)
PATIENT NAME:  [name], [name] MR#:  [id number] DATE OF BIRTH:  12-20-1913  DATE OF ADMISSION:  07/04/2014 DATE OF DISCHARGE:  07/07/2014   ADMITTING DIAGNOSIS: Altered mental status.   DISCHARGE DIAGNOSES:  1.  Acute encephalopathy due to urinary tract infection and dehydration; now the patient's mental status improved.  2.  Recurrent urinary tract infections.  3.  Hyponatremia secondary to dehydration; now sodium normal.  4.  Hypertension with accelerated blood pressure; blood pressure is now improved.  5.  Insomnia per her daughter-in-law; she wanted me to try something, so the patient was started on low-dose Seroquel.  6.  History of glaucoma.  7.  Dementia.  8.  Generalized debility and deconditioning.  9.  History of cerebrovascular accident in 2000;  without any residual weakness.  10.  Hyperlipidemia.  11.  Alzheimer dementia.  12.  Sick sinus syndrome.  13.  Status post pacemaker placement.  14.  Anxiety.  15.  History of syncope.  16.  Orthostatic hypotension.  17.  Status post hysterectomy.   PERTINENT LABS AND EVALUATIONS: Admitting BUN 20, creatinine 1.59, sodium 130.  Urinalysis: 2+ bacteria, WBCs 7. CT scan of the head: No acute intracranial abnormality, stable atrophic chronic white matter disease, left old parietal stroke. Laboratories: Glucose 82, BUN 20, creatinine 1.59. Her creatinine was 1.28. Her WBC count was 6.3, hemoglobin 12, and platelet count was 202,000. Urine cultures greater than 100,000 Escherichia coli. Blood cultures x 2: No growth. CT scan head without contrast showed no acute intracranial abnormalities, atrophy.   HOSPITAL COURSE: Please refer to H and P done by the admitting physician. The patient is a 81 year old white female with history of hypertension, hyperlipidemia, dementia, previous history of CVA status post pacemaker placement, who is brought to the ED after having altered mental status. The patient was not eating or drinking much. She was sent to  the ED, and was noted to have an abnormal urinalysis and an elevated BUN and creatinine, consistent with a urinary tract infection and dehydration. The patient's acute encephalopathy was felt to be due to a urinary tract infection and dehydration with IV fluids and antibiotics. Her mental status is currently improved. She does have some confusion, which is kind of expected at her age.   The patient's urinary tract infection, her urinalysis was abnormal. However, urine culture did not grow anything due to recent antibiotics.   At this time, the patient is doing much better. I have discussed the situation with her daughter-in-law who, is her caregiver. She is agreeable for her to be discharged back to the skilled nursing facility.   DISCHARGE MEDICATIONS: CertaVite with antioxidants 1 tablet p.o. daily, Plavix 75 p.o. daily, Flomax 0.4 daily, brimonidine  0.2% one drop to each eye b.i.d., dorzolamide timolol 1 drop b.i.d., latanoprost 0.005% one drop to each eye at bedtime, Bisac-Evac 10 mg rectal suppository as needed for constipation, milk of magnesia 30 mL q. 12 p.r.n., iron sulfate 45 mg daily, naproxen 220 one tablets p.o. daily, amlodipine 2.5 daily, Voltaren topical applied to affected area at bedtime, Tylenol 650 q. 6 hours p.r.n., quetiapine 12.5 mg at bedtime, Ceftin 500 mg 1 tablet p.o. b.i.d. x 5 days.   DIET: Low-sodium, low-fat, low-cholesterol, pureed, thin liquids. Strict aspiration precautions, no straws, medications in puree crushed as able, feeding assistant at all meals.   ACTIVITY: As tolerated with PT evaluation and treatment.   Follow with primary M.D. in 1 to 2 weeks.   TIME SPENT: 35 minutes  on this discharge.    ____________________________ Lacie ScottsShreyang H. Allena KatzPatel, MD shp:MT D: 07/07/2014 09:11:00 ET T: 07/07/2014 09:31:17 ET JOB#: [id number]  cc: Lurie Mullane H. Allena KatzPatel, MD, <Dictator> Charise CarwinSHREYANG H Darlette Dubow MD ELECTRONICALLY SIGNED 07/08/2014 8:30

## 2015-03-09 NOTE — Consult Note (Signed)
   Comments   I had a lengthy meeting with pt's daughter-in-law/HCPOA, Lemar Livingslsie Madewell. She says that pt has been at [facility] for ~ 6 yrs and is very comfortable there. Daughter-in-law hopes that pt can return to [facility] if at all possible. Daughter-in-law would like to avoid rehospitalizations.  has been followed by [facility] in the past (2011) but was d/c'd due to stability. In light of decline since that time and with recurrent hospitalizations, will order hospice screen.  Dx: protein calorie malnutrition   Secondary Dx: FTT, Alzheimer's dementia, cerebrovascular disease  Electronic Signatures: Geno Sydnor, Harriett SineNancy (MD)  (Signed 24-Aug-15 12:06)  Authored: Palliative Care   Last Updated: 24-Aug-15 12:06 by Venia Riveron, Harriett SineNancy (MD)

## 2016-05-21 ENCOUNTER — Emergency Department

## 2016-05-21 ENCOUNTER — Encounter: Payer: Self-pay | Admitting: Emergency Medicine

## 2016-05-21 ENCOUNTER — Emergency Department
Admission: EM | Admit: 2016-05-21 | Discharge: 2016-05-22 | Disposition: A | Discharge status: Skilled Nursing Facility | Attending: Emergency Medicine | Admitting: Emergency Medicine

## 2016-05-21 DIAGNOSIS — I10 Essential (primary) hypertension: Secondary | ICD-10-CM | POA: Diagnosis not present

## 2016-05-21 DIAGNOSIS — Y929 Unspecified place or not applicable: Secondary | ICD-10-CM | POA: Diagnosis not present

## 2016-05-21 DIAGNOSIS — W01190A Fall on same level from slipping, tripping and stumbling with subsequent striking against furniture, initial encounter: Secondary | ICD-10-CM | POA: Diagnosis not present

## 2016-05-21 DIAGNOSIS — Y999 Unspecified external cause status: Secondary | ICD-10-CM | POA: Insufficient documentation

## 2016-05-21 DIAGNOSIS — W19XXXA Unspecified fall, initial encounter: Secondary | ICD-10-CM

## 2016-05-21 DIAGNOSIS — T148XXA Other injury of unspecified body region, initial encounter: Secondary | ICD-10-CM

## 2016-05-21 DIAGNOSIS — Y939 Activity, unspecified: Secondary | ICD-10-CM | POA: Diagnosis not present

## 2016-05-21 DIAGNOSIS — S0990XA Unspecified injury of head, initial encounter: Secondary | ICD-10-CM | POA: Diagnosis present

## 2016-05-21 DIAGNOSIS — S0083XA Contusion of other part of head, initial encounter: Secondary | ICD-10-CM | POA: Diagnosis not present

## 2016-05-21 HISTORY — DX: Essential (primary) hypertension: I10

## 2016-05-21 LAB — CBC
HCT: 30.8 % — ABNORMAL LOW (ref 35.0–47.0)
Hemoglobin: 10.3 g/dL — ABNORMAL LOW (ref 12.0–16.0)
MCH: 33 pg (ref 26.0–34.0)
MCHC: 33.6 g/dL (ref 32.0–36.0)
MCV: 98.2 fL (ref 80.0–100.0)
PLATELETS: 211 10*3/uL (ref 150–440)
RBC: 3.13 MIL/uL — AB (ref 3.80–5.20)
RDW: 12.7 % (ref 11.5–14.5)
WBC: 9 10*3/uL (ref 3.6–11.0)

## 2016-05-21 LAB — BASIC METABOLIC PANEL
ANION GAP: 9 (ref 5–15)
BUN: 46 mg/dL — AB (ref 6–20)
CALCIUM: 8.8 mg/dL — AB (ref 8.9–10.3)
CO2: 23 mmol/L (ref 22–32)
Chloride: 103 mmol/L (ref 101–111)
Creatinine, Ser: 1.56 mg/dL — ABNORMAL HIGH (ref 0.44–1.00)
GFR calc Af Amer: 30 mL/min — ABNORMAL LOW (ref >60)
GFR, EST NON AFRICAN AMERICAN: 26 mL/min — AB (ref >60)
GLUCOSE: 131 mg/dL — AB (ref 65–99)
POTASSIUM: 4.8 mmol/L (ref 3.5–5.1)
SODIUM: 135 mmol/L (ref 135–145)

## 2016-05-21 LAB — PROTIME-INR
INR: 1.1
PROTHROMBIN TIME: 14.4 s (ref 11.4–15.0)

## 2016-05-21 MED ORDER — TRAMADOL HCL 50 MG PO TABS
50.0000 mg | ORAL_TABLET | Freq: Once | ORAL | Status: DC
Start: 2016-05-21 — End: 2016-05-22

## 2016-05-21 MED ORDER — TRAMADOL HCL 50 MG PO TABS
ORAL_TABLET | ORAL | Status: AC
  Filled 2016-05-21: qty 1

## 2016-05-21 NOTE — ED Notes (Signed)
RN has attempted to call [facility] facility with no answer.

## 2016-05-21 NOTE — ED Notes (Signed)
Pt to ED via EMS from [facility]  For mechanical fall at dinner table. Pt has large hematoma to forehead. EMS reports use of asprin. Pt Alert but non verbal.

## 2016-05-21 NOTE — ED Notes (Signed)
Family at bedside, states pt is at her baseline

## 2016-05-21 NOTE — ED Notes (Addendum)
Per facility, [name] [name] ,  pt has baseline dementia and sun downing , usually responds, sometimes follows commands , pt incontinent. MD notified . Number called [phone]

## 2016-05-21 NOTE — ED Provider Notes (Signed)
[facility] Emergency Department Provider Note  Time seen: 7:49 PM  I have reviewed the triage vital signs and the nursing notes.   HISTORY  Chief Complaint Fall    HPI [name] [name] is a 82 y.o. female with a past medical history of hypertension who presents to the emergency department from [facility] nursing home after a mechanical fall witnessed while at the dinner table. Per EMS report the patient tripped falling forward hitting her forehead on the ground. No reported LOC. Per EMS report at baseline the patient is alert, and interactive somewhat. Currently the patient is alert but is not talking, she will respond to you such as looking you when you're talking, etc. Patient does have a large hematoma to her forehead. Patient unable to contribute to her history.     Past Medical History  Diagnosis Date  . Hypertension     There are no active problems to display for this patient.   No past surgical history on file.  No current outpatient prescriptions on file.  Allergies Review of patient's allergies indicates no known allergies.  No family history on file.  Social History Social History  Substance Use Topics  . Smoking status: None  . Smokeless tobacco: None  . Alcohol Use: None    Review of Systems Unable to obtain review of systems due to altered mental status versus baseline dementia. ____________________________________________   PHYSICAL EXAM:  VITAL SIGNS: ED Triage Vitals  Enc Vitals Group     BP --      Pulse Rate 05/21/16 1939 76     Resp 05/21/16 1939 22     Temp 05/21/16 1939 97 F (36.1 C)     Temp Source 05/21/16 1939 Axillary     SpO2 05/21/16 1939 100 %     Weight --      Height --      Head Cir --      Peak Flow --      Pain Score --      Pain Loc --      Pain Edu? --      Excl. in GC? --     Constitutional: Alert, Will look at you want to you talk to her. Does not follow commands. Does not answer questions.  It is unclear what her baseline is. Eyes: Normal exam ENT   Head: Large hematoma to central forehead.   Mouth/Throat: Mucous membranes are moist. Cardiovascular: Normal rate, regular rhythm. No murmur Respiratory: Normal respiratory effort without tachypnea nor retractions. Breath sounds are clear  Gastrointestinal: Soft and nontender. No distention.  No reaction to abdominal palpation. Musculoskeletal: Nontender with normal range of motion in all extremities. Is able to range lower extremities and upper extremities without apparent discomfort. No reaction to hip palpation. Pelvis is stable. Neurologic:  Normal speech and language. No gross focal neurologic deficits Skin:  Skin is warm, dry and intact.  Psychiatric: Alert, but nonverbal, unclear baseline.  ____________________________________________    RADIOLOGY  X-ray negative for fracture. No complaints of constipation.  ____________________________________________    INITIAL IMPRESSION / ASSESSMENT AND PLAN / ED COURSE  Pertinent labs & imaging results that were available during my care of the patient were reviewed by me and considered in my medical decision making (see chart for details).  The patient presents to the emergency department after a fall witnessed at her nursing facility. The patient has a large forehead hematoma. We'll obtain CT scans of the head and neck,  as well as chest and pelvis x-rays. It is not clear what the patient's baseline mental status is at this time, currently attempting to reach nursing home for verification. Currently the patient is alert will look at you while talking but is not following commands.  We will discharge the patient back to her nursing facility. Patient and daughter are agreeable. ____________________________________________   FINAL CLINICAL IMPRESSION(S) / ED DIAGNOSES  [name] RossFall   Adreyan Carbajal, MD 05/21/16 2340

## 2016-05-21 NOTE — Discharge Instructions (Signed)

## 2016-08-23 ENCOUNTER — Emergency Department
Admission: EM | Admit: 2016-08-23 | Discharge: 2016-08-24 | Disposition: A | Discharge status: Home / Self Care | Attending: Emergency Medicine | Admitting: Emergency Medicine

## 2016-08-23 ENCOUNTER — Encounter: Payer: Self-pay | Admitting: Emergency Medicine

## 2016-08-23 ENCOUNTER — Emergency Department

## 2016-08-23 DIAGNOSIS — S0083XA Contusion of other part of head, initial encounter: Secondary | ICD-10-CM | POA: Insufficient documentation

## 2016-08-23 DIAGNOSIS — R319 Hematuria, unspecified: Secondary | ICD-10-CM | POA: Diagnosis not present

## 2016-08-23 DIAGNOSIS — Y999 Unspecified external cause status: Secondary | ICD-10-CM | POA: Diagnosis not present

## 2016-08-23 DIAGNOSIS — G309 Alzheimer's disease, unspecified: Secondary | ICD-10-CM | POA: Diagnosis not present

## 2016-08-23 DIAGNOSIS — Y92129 Unspecified place in nursing home as the place of occurrence of the external cause: Secondary | ICD-10-CM | POA: Insufficient documentation

## 2016-08-23 DIAGNOSIS — Y92009 Unspecified place in unspecified non-institutional (private) residence as the place of occurrence of the external cause: Secondary | ICD-10-CM

## 2016-08-23 DIAGNOSIS — S0990XA Unspecified injury of head, initial encounter: Secondary | ICD-10-CM | POA: Diagnosis present

## 2016-08-23 DIAGNOSIS — I10 Essential (primary) hypertension: Secondary | ICD-10-CM | POA: Diagnosis not present

## 2016-08-23 DIAGNOSIS — Y939 Activity, unspecified: Secondary | ICD-10-CM | POA: Insufficient documentation

## 2016-08-23 DIAGNOSIS — N390 Urinary tract infection, site not specified: Secondary | ICD-10-CM | POA: Insufficient documentation

## 2016-08-23 DIAGNOSIS — W1809XA Striking against other object with subsequent fall, initial encounter: Secondary | ICD-10-CM | POA: Insufficient documentation

## 2016-08-23 DIAGNOSIS — W19XXXA Unspecified fall, initial encounter: Secondary | ICD-10-CM

## 2016-08-23 HISTORY — DX: Hyperlipidemia, unspecified: E78.5

## 2016-08-23 HISTORY — DX: Alzheimer's disease, unspecified: G30.9

## 2016-08-23 HISTORY — DX: Anxiety disorder, unspecified: F41.9

## 2016-08-23 HISTORY — DX: Dementia in other diseases classified elsewhere, unspecified severity, without behavioral disturbance, psychotic disturbance, mood disturbance, and anxiety: F02.80

## 2016-08-23 HISTORY — DX: Unspecified glaucoma: H40.9

## 2016-08-23 LAB — URINALYSIS COMPLETE WITH MICROSCOPIC (ARMC ONLY)
BILIRUBIN URINE: NEGATIVE
GLUCOSE, UA: NEGATIVE mg/dL
KETONES UR: NEGATIVE mg/dL
NITRITE: NEGATIVE
Protein, ur: 100 mg/dL — AB
SPECIFIC GRAVITY, URINE: 1.012 (ref 1.005–1.030)
pH: 5 (ref 5.0–8.0)

## 2016-08-23 LAB — CBC WITH DIFFERENTIAL/PLATELET
BASOS ABS: 0.1 10*3/uL (ref 0–0.1)
BASOS PCT: 1 %
EOS PCT: 5 %
Eosinophils Absolute: 0.4 10*3/uL (ref 0–0.7)
HCT: 33.3 % — ABNORMAL LOW (ref 35.0–47.0)
Hemoglobin: 11.1 g/dL — ABNORMAL LOW (ref 12.0–16.0)
LYMPHS PCT: 11 %
Lymphs Abs: 0.9 10*3/uL — ABNORMAL LOW (ref 1.0–3.6)
MCH: 32.9 pg (ref 26.0–34.0)
MCHC: 33.5 g/dL (ref 32.0–36.0)
MCV: 98.3 fL (ref 80.0–100.0)
MONO ABS: 0.7 10*3/uL (ref 0.2–0.9)
Monocytes Relative: 9 %
NEUTROS ABS: 5.9 10*3/uL (ref 1.4–6.5)
Neutrophils Relative %: 74 %
PLATELETS: 194 10*3/uL (ref 150–440)
RBC: 3.39 MIL/uL — AB (ref 3.80–5.20)
RDW: 13.7 % (ref 11.5–14.5)
WBC: 8 10*3/uL (ref 3.6–11.0)

## 2016-08-23 LAB — BASIC METABOLIC PANEL
ANION GAP: 8 (ref 5–15)
BUN: 36 mg/dL — ABNORMAL HIGH (ref 6–20)
CALCIUM: 9 mg/dL (ref 8.9–10.3)
CHLORIDE: 106 mmol/L (ref 101–111)
CO2: 25 mmol/L (ref 22–32)
Creatinine, Ser: 1.08 mg/dL — ABNORMAL HIGH (ref 0.44–1.00)
GFR calc non Af Amer: 41 mL/min — ABNORMAL LOW (ref >60)
GFR, EST AFRICAN AMERICAN: 47 mL/min — AB (ref >60)
Glucose, Bld: 95 mg/dL (ref 65–99)
Potassium: 4 mmol/L (ref 3.5–5.1)
Sodium: 139 mmol/L (ref 135–145)

## 2016-08-23 LAB — TROPONIN I

## 2016-08-23 MED ORDER — DEXTROSE 5 % IV SOLN
1.0000 g | Freq: Once | INTRAVENOUS | Status: AC
  Administered 2016-08-23: 1 g via INTRAVENOUS
  Filled 2016-08-23: qty 10

## 2016-08-23 MED ORDER — CEPHALEXIN 500 MG PO CAPS: 500.0000 mg | ORAL_CAPSULE | Freq: Two times a day (BID) | ORAL | 0 refills | Status: DC

## 2016-08-23 NOTE — Discharge Instructions (Signed)
You were evaluated after an unwitnessed fall, no serious traumatic injury is suspected.  You're found to have a urinary tract infection, and given a 24-hour dose of IV Rocephin in the emergency department. Start Keflex tomorrow evening.  Return to the emergency room for any worsening condition including confusion or altered mental status, vomiting, fever, or any other symptoms concerning to you.

## 2016-08-23 NOTE — ED Notes (Signed)
Pt transported to CT ?

## 2016-08-23 NOTE — ED Notes (Signed)
Dr. Shaune PollackLord notified of pt BP. Dr. Shaune PollackLord states that pt is not symptomatic. Pt does become agitated every time bp cuff inflates. Will cont to monitor.

## 2016-08-23 NOTE — ED Triage Notes (Signed)
Pt arrived by EMS from [facility] after an unwitnessed fall. The pt fell out of her bed and hit her head on the metal bed rail. Pt has a hematoma on her forehead and has no other c/o of pain.

## 2016-08-23 NOTE — ED Provider Notes (Signed)
[facility] Emergency Department Provider Note ____________________________________________   I have reviewed the triage vital signs and the triage nursing note.  HISTORY  Chief Complaint Fall   Historian Level 5 caveat = Patient unable to provide history due to poor historian Ems report from nursing home  HPI [name] [name] is a 82 y.o. female who stays at a nursing home, was found beside her bed on the floor and appeared to have fallen striking her head on the metal railing and there is a small red bump in the middle of her forehead. Staff reports she had been checked on about 30 minutes prior. No reported recent illnesses.  I spoke with daughter-in-law by phone who states that she doesn't eat very well and also has frequent urinary tract infections and they did request that I go ahead and check for medical causes for unwitnessed fall.  On exam, patient states that she struck her forehead, but denies neck pain and chest pain and abdominal pain and hip and extremity pains.  It seems like the head pain is mild.    Past Medical History:  Diagnosis Date  . Alzheimer disease   . Anxiety disorder   . Glaucoma   . Hyperlipemia   . Hypertension     There are no active problems to display for this patient.   History reviewed. No pertinent surgical history.  Prior to Admission medications   Medication Sig Start Date End Date Taking? Authorizing Provider  cephALEXin (KEFLEX) 500 MG capsule Take 1 capsule (500 mg total) by mouth 2 (two) times daily. 08/23/16   Governor Rooks, MD    No Known Allergies  History reviewed. No pertinent family history.  Social History Social History  Substance Use Topics  . Smoking status: Unknown If Ever Smoked  . Smokeless tobacco: Not on file  . Alcohol use Not on file    Review of Systems Limited due to poor historian but these are her answers Constitutional:  Eyes: Negative for visual changes. ENT:  Cardiovascular:  Negative for chest pain. Respiratory: Negative for shortness of breath. Gastrointestinal: Negative for abdominal pain Genitourinary: Negative for dysuria. Musculoskeletal: Negative for back pain. Skin:  Neurological: Mild headache 10 point Review of Systems otherwise negative ____________________________________________   PHYSICAL EXAM:  VITAL SIGNS: ED Triage Vitals  Enc Vitals Group     BP      Pulse      Resp      Temp      Temp src      SpO2      Weight      Height      Head Circumference      Peak Flow      Pain Score      Pain Loc      Pain Edu?      Excl. in GC?      Constitutional: Alert and Cooperative. She is in no acute distress.  She is cachectic. HEENT   Head: Small hematoma mid forehead. Area of redness to the right parietal scalp, unclear if this is some sort of eczema patch, or abrasion from fall today      Eyes: Conjunctivae are normal. PERRL. Normal extraocular movements.      Ears:         Nose: No congestion/rhinnorhea.   Mouth/Throat: Mucous membranes are moist.   Neck: No stridor. Kyphosis, no step-offs, nontender cervical spine. Cardiovascular/Chest: Normal rate, regular rhythm.  No murmurs, rubs, or gallops. Respiratory:  Normal respiratory effort without tachypnea nor retractions. Breath sounds are clear and equal bilaterally. No wheezes/rales/rhonchi. Gastrointestinal: Soft. No distention, no guarding, no rebound. Nontender.    Genitourinary/rectal:Deferred Musculoskeletal: Pelvis stable, nontender with hip range of motion bilaterally. No extremity deformities or pain in the upper extremity is either. Neurologic:  Very hard of hearing, but cooperative and interactive. No facial droop. No slurred speech. No apparent expressive or receptive aphasia. No gross or focal neurologic deficits are appreciated. Skin:  Skin is warm, dry and intact. Psychiatric: Following commands, no acute  agitation.   ____________________________________________  LABS (pertinent positives/negatives)  Labs Reviewed  BASIC METABOLIC PANEL - Abnormal; Notable for the following:       Result Value   BUN 36 (*)    Creatinine, Ser 1.08 (*)    GFR calc non Af Amer 41 (*)    GFR calc Af Amer 47 (*)    All other components within normal limits  CBC WITH DIFFERENTIAL/PLATELET - Abnormal; Notable for the following:    RBC 3.39 (*)    Hemoglobin 11.1 (*)    HCT 33.3 (*)    Lymphs Abs 0.9 (*)    All other components within normal limits  URINALYSIS COMPLETEWITH MICROSCOPIC ([facility] ONLY) - Abnormal; Notable for the following:    Color, Urine YELLOW (*)    APPearance CLOUDY (*)    Hgb urine dipstick 2+ (*)    Protein, ur 100 (*)    Leukocytes, UA 3+ (*)    Bacteria, UA RARE (*)    Squamous Epithelial / LPF 0-5 (*)    All other components within normal limits  URINE CULTURE  TROPONIN I    ____________________________________________    EKG I, Governor Rooksebecca Adit Riddles, MD, the attending physician have personally viewed and interpreted all ECGs.  63 bpm atrial fibrillation since then ventricularly paced rhythm. ____________________________________________  RADIOLOGY All Xrays were viewed by me. Imaging interpreted by Radiologist.  CT head and cervical spine noncontrast:  IMPRESSION: Stable age related cerebral atrophy, ventriculomegaly and severe periventricular white matter disease.  No acute intracranial findings or skull fracture  Chronic left half sphenoid sinus disease.  Stable degenerative cervical spondylosis but no acute cervical spine fracture or canal compromise. __________________________________________  PROCEDURES  Procedure(s) performed: None  Critical Care performed: None  ____________________________________________   ED COURSE / ASSESSMENT AND PLAN  Pertinent labs & imaging results that were available during my care of the patient were reviewed by me and  considered in my medical decision making (see chart for details).   I spoke with [name]'s daughter-in-law by phone who was interested in checking for urinary tract infection and screening set of laboratory studies given the unwitnessed fall, although it sounds like she is probably at her baseline mental status. In terms of traumatic injuries, no apparent extremity, chest, abdominal or back injuries. She does have a hematoma to her forehead and given her dementia I did check CT of the cervical spine although she's not complaining of significant pain, given her age and kyphosis as concerned about mechanism for injury.  CT of the head and C-spine are reassuring for no significant traumatic injuries.   Laboratory studies are reassuring. Urinalysis is showing urine or tract infection. Patient was given 1 dose of IV Rocephin here in the emergency department. A urine culture was sent. Patient will be discharged with prescription for Keflex.    CONSULTATIONS:   None   ___________________________________________   FINAL CLINICAL IMPRESSION(S) / ED DIAGNOSES   Final diagnoses:  Fall in home, initial encounter  Contusion of face, initial encounter  Urinary tract infection with hematuria, site unspecified              Note: This dictation was prepared with Dragon dictation. Any transcriptional errors that result from this process are unintentional    Governor Rooks, MD 08/23/16 2118

## 2016-08-27 LAB — URINE CULTURE: Culture: >=100000 — AB

## 2016-09-20 ENCOUNTER — Emergency Department
Admission: EM | Admit: 2016-09-20 | Discharge: 2016-09-21 | Disposition: A | Discharge status: Home / Self Care | Attending: Emergency Medicine | Admitting: Emergency Medicine

## 2016-09-20 ENCOUNTER — Encounter: Payer: Self-pay | Admitting: Emergency Medicine

## 2016-09-20 DIAGNOSIS — I10 Essential (primary) hypertension: Secondary | ICD-10-CM | POA: Diagnosis not present

## 2016-09-20 DIAGNOSIS — Z7982 Long term (current) use of aspirin: Secondary | ICD-10-CM | POA: Insufficient documentation

## 2016-09-20 DIAGNOSIS — Y999 Unspecified external cause status: Secondary | ICD-10-CM | POA: Insufficient documentation

## 2016-09-20 DIAGNOSIS — S0081XA Abrasion of other part of head, initial encounter: Secondary | ICD-10-CM | POA: Insufficient documentation

## 2016-09-20 DIAGNOSIS — Y939 Activity, unspecified: Secondary | ICD-10-CM | POA: Insufficient documentation

## 2016-09-20 DIAGNOSIS — S0990XA Unspecified injury of head, initial encounter: Secondary | ICD-10-CM | POA: Diagnosis present

## 2016-09-20 DIAGNOSIS — G309 Alzheimer's disease, unspecified: Secondary | ICD-10-CM | POA: Diagnosis not present

## 2016-09-20 DIAGNOSIS — Z79899 Other long term (current) drug therapy: Secondary | ICD-10-CM | POA: Insufficient documentation

## 2016-09-20 DIAGNOSIS — Y929 Unspecified place or not applicable: Secondary | ICD-10-CM | POA: Diagnosis not present

## 2016-09-20 DIAGNOSIS — T148XXA Other injury of unspecified body region, initial encounter: Secondary | ICD-10-CM

## 2016-09-20 DIAGNOSIS — W19XXXA Unspecified fall, initial encounter: Secondary | ICD-10-CM | POA: Insufficient documentation

## 2016-09-20 NOTE — ED Triage Notes (Signed)
Pt from [facility] with unwitnessed fall. Per ems pt was found on floor by staff next to her bed. Pt had a previous fall today resulting in abrasion to forehead. Pt unsure if loc occurred. Pt complains of "pain all over". Pt is currently alert to self only, ems states is unsure of pt's chronic mental status. perrl 2mm, pt moving all extremities.

## 2016-09-21 ENCOUNTER — Emergency Department

## 2016-09-21 ENCOUNTER — Emergency Department
Admission: EM | Admit: 2016-09-21 | Discharge: 2016-09-21 | Disposition: A | Discharge status: Home / Self Care | Source: Home / Self Care | Attending: Emergency Medicine | Admitting: Emergency Medicine

## 2016-09-21 ENCOUNTER — Encounter: Payer: Self-pay | Admitting: Emergency Medicine

## 2016-09-21 DIAGNOSIS — W19XXXA Unspecified fall, initial encounter: Secondary | ICD-10-CM | POA: Insufficient documentation

## 2016-09-21 DIAGNOSIS — Z7982 Long term (current) use of aspirin: Secondary | ICD-10-CM | POA: Insufficient documentation

## 2016-09-21 DIAGNOSIS — G309 Alzheimer's disease, unspecified: Secondary | ICD-10-CM | POA: Insufficient documentation

## 2016-09-21 DIAGNOSIS — Z79899 Other long term (current) drug therapy: Secondary | ICD-10-CM | POA: Insufficient documentation

## 2016-09-21 DIAGNOSIS — S0181XA Laceration without foreign body of other part of head, initial encounter: Secondary | ICD-10-CM | POA: Insufficient documentation

## 2016-09-21 DIAGNOSIS — S0003XA Contusion of scalp, initial encounter: Secondary | ICD-10-CM

## 2016-09-21 DIAGNOSIS — I10 Essential (primary) hypertension: Secondary | ICD-10-CM | POA: Insufficient documentation

## 2016-09-21 DIAGNOSIS — Y939 Activity, unspecified: Secondary | ICD-10-CM | POA: Insufficient documentation

## 2016-09-21 DIAGNOSIS — Y999 Unspecified external cause status: Secondary | ICD-10-CM

## 2016-09-21 DIAGNOSIS — Y9289 Other specified places as the place of occurrence of the external cause: Secondary | ICD-10-CM

## 2016-09-21 LAB — HEPATIC FUNCTION PANEL
ALBUMIN: 3.5 g/dL (ref 3.5–5.0)
ALK PHOS: 87 U/L (ref 38–126)
ALT: 11 U/L — AB (ref 14–54)
AST: 24 U/L (ref 15–41)
BILIRUBIN TOTAL: 0.6 mg/dL (ref 0.3–1.2)
Total Protein: 6.3 g/dL — ABNORMAL LOW (ref 6.5–8.1)

## 2016-09-21 LAB — BASIC METABOLIC PANEL
ANION GAP: 9 (ref 5–15)
BUN: 28 mg/dL — AB (ref 6–20)
CHLORIDE: 105 mmol/L (ref 101–111)
CO2: 27 mmol/L (ref 22–32)
Calcium: 8.9 mg/dL (ref 8.9–10.3)
Creatinine, Ser: 1.02 mg/dL — ABNORMAL HIGH (ref 0.44–1.00)
GFR calc Af Amer: 51 mL/min — ABNORMAL LOW (ref >60)
GFR calc non Af Amer: 44 mL/min — ABNORMAL LOW (ref >60)
Glucose, Bld: 105 mg/dL — ABNORMAL HIGH (ref 65–99)
POTASSIUM: 3.6 mmol/L (ref 3.5–5.1)
SODIUM: 141 mmol/L (ref 135–145)

## 2016-09-21 LAB — CBC WITH DIFFERENTIAL/PLATELET
Basophils Absolute: 0.1 10*3/uL (ref 0–0.1)
Basophils Relative: 1 %
EOS ABS: 0.2 10*3/uL (ref 0–0.7)
Eosinophils Relative: 3 %
HCT: 30.1 % — ABNORMAL LOW (ref 35.0–47.0)
HEMOGLOBIN: 10.3 g/dL — AB (ref 12.0–16.0)
LYMPHS ABS: 0.7 10*3/uL — AB (ref 1.0–3.6)
LYMPHS PCT: 9 %
MCH: 33 pg (ref 26.0–34.0)
MCHC: 34.1 g/dL (ref 32.0–36.0)
MCV: 96.8 fL (ref 80.0–100.0)
Monocytes Absolute: 0.5 10*3/uL (ref 0.2–0.9)
Monocytes Relative: 7 %
NEUTROS PCT: 80 %
Neutro Abs: 5.8 10*3/uL (ref 1.4–6.5)
Platelets: 194 10*3/uL (ref 150–440)
RBC: 3.1 MIL/uL — AB (ref 3.80–5.20)
RDW: 14 % (ref 11.5–14.5)
WBC: 7.3 10*3/uL (ref 3.6–11.0)

## 2016-09-21 LAB — URINALYSIS COMPLETE WITH MICROSCOPIC (ARMC ONLY)
BILIRUBIN URINE: NEGATIVE
Bacteria, UA: NONE SEEN
GLUCOSE, UA: NEGATIVE mg/dL
KETONES UR: NEGATIVE mg/dL
LEUKOCYTES UA: NEGATIVE
Nitrite: NEGATIVE
Protein, ur: 100 mg/dL — AB
SQUAMOUS EPITHELIAL / LPF: NONE SEEN
Specific Gravity, Urine: 1.013 (ref 1.005–1.030)
pH: 6 (ref 5.0–8.0)

## 2016-09-21 LAB — TROPONIN I

## 2016-09-21 NOTE — ED Notes (Signed)
Report to janie, rn.  

## 2016-09-21 NOTE — ED Provider Notes (Signed)
[facility] Emergency Department Provider Note   ____________________________________________   None    (approximate)  I have reviewed the triage vital signs and the nursing notes.   HISTORY  Chief Complaint Fall  Limited by dementia  HPI [name] [name] is an 82 y.o. female who returns to the ED from [facility] with a chief complaint of unwitnessed fall. This is patient's third fall in the past 24 hours. She was just seen several hours ago in the ED for unwitnessed fall. CT head was negative and she was discharged back to the facility. Patient returns after staff found her on the floor by her bed. Now with left forehead hematoma with laceration. Rest of history is unobtainable secondary to patient's dementia.   Past Medical History:  Diagnosis Date  . Alzheimer disease   . Anxiety disorder   . Glaucoma   . Hyperlipemia   . Hypertension     There are no active problems to display for this patient.   History reviewed. No pertinent surgical history.  Prior to Admission medications   Medication Sig Start Date End Date Taking? Authorizing Provider  acetaminophen (TYLENOL) 325 MG tablet Take 650 mg by mouth 3 (three) times daily as needed for mild pain.    Historical Provider, MD  amLODipine (NORVASC) 2.5 MG tablet Take 2.5 mg by mouth daily.    Historical Provider, MD  aspirin EC 81 MG tablet Take 81 mg by mouth daily.    Historical Provider, MD  brimonidine (ALPHAGAN) 0.2 % ophthalmic solution Place 1 drop into both eyes 2 (two) times daily.    Historical Provider, MD  cephALEXin (KEFLEX) 500 MG capsule Take 1 capsule (500 mg total) by mouth 2 (two) times daily. Patient not taking: Reported on 09/21/2016 08/23/16   Governor Rooksebecca Lord, MD  diclofenac sodium (VOLTAREN) 1 % GEL Apply 2 g topically at bedtime. Apply to affected joints    Historical Provider, MD  dorzolamide-timolol (COSOPT) 22.3-6.8 MG/ML ophthalmic solution Place 1 drop into both  eyes 2 (two) times daily.    Historical Provider, MD  latanoprost (XALATAN) 0.005 % ophthalmic solution Place 1 drop into both eyes at bedtime.    Historical Provider, MD  LORazepam (ATIVAN) 0.5 MG tablet Take 0.5 mg by mouth every 6 (six) hours as needed for anxiety.    Historical Provider, MD  naproxen sodium (ANAPROX) 220 MG tablet Take 220 mg by mouth daily.    Historical Provider, MD  tamsulosin (FLOMAX) 0.4 MG CAPS capsule Take 0.4 mg by mouth daily.    Historical Provider, MD    Allergies Patient has no known allergies.  History reviewed. No pertinent family history.  Social History Social History  Substance Use Topics  . Smoking status: Unknown If Ever Smoked  . Smokeless tobacco: Not on file  . Alcohol use No    Review of Systems  Constitutional: Positive for fall and left forehead hematoma with laceration. No fever/chills. Eyes: No visual changes. ENT: No sore throat. Cardiovascular: Denies chest pain. Respiratory: Denies shortness of breath. Gastrointestinal: No abdominal pain.  No nausea, no vomiting.  No diarrhea.  No constipation. Genitourinary: Negative for dysuria. Musculoskeletal: Negative for back pain. Skin: Negative for rash. Neurological: Negative for headaches, focal weakness or numbness.  10-point ROS otherwise negative.  ____________________________________________   PHYSICAL EXAM:  VITAL SIGNS: ED Triage Vitals  Enc Vitals Group     BP 09/21/16 0630 (!) 180/102     Pulse Rate 09/21/16 0630 70  Resp 09/21/16 0630 20     Temp 09/21/16 0630 97.3 F (36.3 C)     Temp Source 09/21/16 0630 Oral     SpO2 09/21/16 0630 96 %     Weight 09/21/16 0631 100 lb (45.4 kg)     Height --      Head Circumference --      Peak Flow --      Pain Score --      Pain Loc --      Pain Edu? --      Excl. in GC? --     Constitutional: Alert and oriented. Well appearing and slightly agitated. Eyes: Conjunctivae are normal. PERRL. EOMI. Arcus  senilius. Head: Left forehead hematoma with slight linear laceration which has clotted. There is no bleeding. Laceration is well-approximated and not gaping. Nose: No congestion/rhinnorhea. Mouth/Throat: Mucous membranes are moist.  Oropharynx non-erythematous. Neck: No stridor.  No cervical spine tenderness to palpation Cardiovascular: Normal rate, regular rhythm. Grossly normal heart sounds.  Good peripheral circulation. Respiratory: Normal respiratory effort.  No retractions. Lungs CTAB. Gastrointestinal: Soft and nontender. No distention. No abdominal bruits. No CVA tenderness. Musculoskeletal: No lower extremity tenderness nor edema.  No joint effusions. Neurologic:  Alert and oriented to person only. Normal speech and language. No gross focal neurologic deficits are appreciated. MAEx4. Skin:  Skin is warm, dry and intact. No rash noted. Psychiatric: Mood and affect are agitated. Speech and behavior are normal.  ____________________________________________   LABS (all labs ordered are listed, but only abnormal results are displayed)  Labs Reviewed  CBC WITH DIFFERENTIAL/PLATELET  BASIC METABOLIC PANEL  TROPONIN I  URINALYSIS COMPLETEWITH MICROSCOPIC ([facility] ONLY)   ____________________________________________  EKG  None ____________________________________________  RADIOLOGY  Pending ____________________________________________   PROCEDURES  Procedure(s) performed: None  Procedures  Critical Care performed: No  ____________________________________________   INITIAL IMPRESSION / ASSESSMENT AND PLAN / ED COURSE  Pertinent labs & imaging results that were available during my care of the patient were reviewed by me and considered in my medical decision making (see chart for details).  82 year old female who returns to the ED status post unwitnessed fall. This makes patient's third fall in the past 24 hours. I spoke with her daughter-in-law [name] [name] via  telephone [phone] who desires medical evaluation. Given patient's agitation, I advised her daughter-in-law that patient might require low-dose calming agent in order to proceed with lab work, urinalysis and CT head. DIL wishes to proceed with evaluation. She also tells me that patient is nonambulatory at baseline. States patient lives in a private room apart from the [facility]. Care transferred to Dr. Mayford KnifeWilliams pending laboratory, urinalysis and CT results. Patient's laceration is well approximated and clotted does not require sutures.  Clinical Course      ____________________________________________   FINAL CLINICAL IMPRESSION(S) / ED DIAGNOSES  Final diagnoses:  Fall, initial encounter  Scalp hematoma, initial encounter      NEW MEDICATIONS STARTED DURING THIS VISIT:  New Prescriptions   No medications on file     Note:  This document was prepared using Dragon voice recognition software and may include unintentional dictation errors.    Irean HongJade J Sung, MD 09/21/16 [phone]

## 2016-09-21 NOTE — ED Notes (Signed)
Pt sleeping, call bell remains at side, fall precautions in place.

## 2016-09-21 NOTE — ED Notes (Signed)
Fall mats placed to floor x2 on either side of bed, bed in low position, call bell at side, siderails up x2, bed alarm on pt's left arm/shoulder.

## 2016-09-21 NOTE — ED Provider Notes (Signed)
  IMPRESSION: No acute intracranial findings.  Chronic ischemic microvascular disease and age related atrophic change. 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  Chronic inflammatory change of the sphenoid sinus.  Labs Reviewed  CBC WITH DIFFERENTIAL/PLATELET - Abnormal; Notable for the following:       Result Value   RBC 3.10 (*)    Hemoglobin 10.3 (*)    HCT 30.1 (*)    Lymphs Abs 0.7 (*)    All other components within normal limits  BASIC METABOLIC PANEL - Abnormal; Notable for the following:    Glucose, Bld 105 (*)    BUN 28 (*)    Creatinine, Ser 1.02 (*)    GFR calc non Af Amer 44 (*)    GFR calc Af Amer 51 (*)    All other components within normal limits  TROPONIN I  URINALYSIS COMPLETEWITH MICROSCOPIC ([facility] ONLY)   Patient is currently on hospice service, cannot be placed in a higher level of care. She appears medically stable to return to her current residence. Family is in agreement with this plan.   Emily FilbertJonathan E Williams, MD 09/21/16 [phone]

## 2016-09-21 NOTE — ED Notes (Signed)
This Clinical research associatewriter attempted to call discharge report to [facility]. No answer.

## 2016-09-21 NOTE — Discharge Instructions (Signed)
Return to the ER for worsening symptoms, persistent vomiting, lethargy, difficulty breathing or other concerns. 
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
## 2016-09-21 NOTE — ED Notes (Signed)
Report called to [employer], med tech and [facility].

## 2016-09-21 NOTE — Care Management Note (Signed)
Case Management Note  Patient Details  Name: [name] [name] MRN: [id number] Date of Birth: 1914-01-15  Subjective/Objective:   Spoke at length to family member at bedside. She has been brought up to date on lack of need for hospital admission. She states they are thrilled with Hospice services so far and wish to continue. Patient will be discharged back to facility.                 Action/Plan:   Expected Discharge Date:                  Expected Discharge Plan:     In-House Referral:     Discharge planning Services     Post Acute Care Choice:    Choice offered to:     DME Arranged:    DME Agency:     HH Arranged:    HH Agency:     Status of Service:     If discussed at [employer] Length of Stay Meetings, dates discussed:    Additional Comments:  Berna BueCheryl Rayhan Groleau, RN 09/21/2016, 11:55 AM

## 2016-09-21 NOTE — ED Triage Notes (Signed)
Pt from [place] with frequent falls. Pt left ed after fall approx 3 hours pta. Pt fell from chair per ems. Pt with laceration noted above left eyebrow. Pt yelling and combative with staff, states "i don't want to be here, I don't want you here". Pt moving all extremities, perrl brisk 47m40LiechtensKentuckyteiIllinoisIndianIllinoisIndia71Korea5Almetta LovGershon MusseIllinoisIndia77 [place] S69423Kerry [phone] MustaARAMARK CorporatioVF Corporati72Leslie AndreIllinoisIndi2[facility]8Mitzi [phone] [phone] HarFrankfort SquElberta FoGlancyrehabilitation Hospitalrt646665iBarnes & NoGershon Mus38ms75LiechtensKentuckyteiIllinoisIndianIllinoisIndia19Korea1Almetta LovGershon MusseIllinoisIndia790 Devon Dri27785Kerry [phone] MustaARAMARK CorporatioVF Corporati10Leslie AndreIllinoisIndi8[facility]0Mitzi [phone] [phone] HarAvondElberta FoLargo Surgery LLC Dba Riccardi Bay Surgery Centerrt9448iBarnes & NoGershon Mus72ms18LiechtensKentuckyteiIllinoisIndianIllinoisIndia49KoreaAlmetta LovGershon MusseIllinoisIndia877 Ridge S53676Kerry [phone] Musta1ARAMARK CorporatioVF Corporati66Leslie AndreIllinoisIndi5[facility]1Mitzi [phone] [phone] HarCaledoElberta FoCommunity Memorial Hospitalrt647207iBarnes & NoGershon Mus61ms30LiechtensKentuckyteiIllinoisIndianIllinoisIndia21Korea3Almetta LovGershon MusseIllinoisIndia7[address] La557Kerry [phone] MustaARAMARK CorporatioVF Corporati67Leslie AndreIllinoisIndi3[facility]2Mitzi Davenp201 [phone] [phone] HarBoniElberta FoAltru Specialty Hospitalrt21964iBarnes & NoGershon Mus38ms87LiechtensKentuckyteiIllinoisIndianIllinoisIndia85Korea4Almetta LovGershon MusseIllinoisIndia771 [place]stone S8718Kerry [phone] MustaARAMARK CorporatioVF Corporati69Leslie AndreIllinoisIndi2[facility]8Mitzi Davenp818 [phone] [phone] HarOswElberta FoSt. Marks Hospitalrt7791iBarnes & NoGershon Mus22ms32LiechtensKentuckyteiIllinoisIndianIllinoisIndia61Korea6Almetta LovGershon MusseIllinoisIndia7286 Cherry Av80327Kerry [phone] MustaARAMARK CorporatioVF Corporati17Leslie AndreIllinoisIndi3[facility]8Mitzi [phone] [phone] HarCottonwElberta FoCanyon Vista Medical Centerrt4805iBarnes & NoGershon Mus20ms48LiechtensKentuckyteiIllinoisIndianIllinoisIndia17Korea6Almetta LovGershon MusseIllinoisIndia9 Branch R67204Kerry [phone] MustaARAMARK CorporatioVF Corporati15Leslie AndreIllinoisIndi1[facility]0Mitzi [phone] [phone] HarMarine VElberta FoPatient’S Choice Medical Center Of Humphreys Countyrt84917iBarnes & NoGershon Mus65ms72LiechtensKentuckyteiIllinoisIndianIllinoisIndia73Korea5Almetta LovGershon MusseIllinoisIndia117 Young La5912Kerry [phone] MustaARAMARK CorporatioVF Corporati10Leslie AndreIllinoisIndi8[facility]4Mitzi [phone] [phone] HarNealmElberta FoAdventist Medical Center Hanfordrt4663iBarnes & NoGershon Mus104ms22LiechtensKentuckyteiIllinoisIndianIllinoisIndia60Korea4Almetta LovGershon MusseIllinoisIndia708 Mill Pond Av6066Kerry [phone] MustaARAMARK CorporatioVF Corporati2Leslie AndreIllinoisIndi7[facility]4Mitzi [phone] For46322043549Essie HarThompElberta FoSentara Obici Ambulatory Surgery LLCrt2870iBarnes & NoGershon Mus69ms8LiechtensKentuckyteiIllinoisIndianIllinoisIndia68Korea2Almetta LovGershon MusseIllinoisIndia7[address] Stre8132Kerry [phone] MustaARAMARK CorporatioVF Corporati72Leslie AndreIllinoisIndiRhea Medical Center2Mitzi [phone] [phone] HarPeeples ValElberta FoOrthopaedic Surgery Center Of Hostetter LLCrt23285iBarnes & NoGershon Mus17ms21LiechtensKentuckyteiIllinoisIndianIllinoisIndia8KoreaAlmetta LovGershon MusseIllinoisIndia1 Saxon S4335Kerry [phone] MustaARAMARK CorporatioVF Corporati58Leslie AndreIllinoisIndi5[facility]3Mitzi [phone] [phone] HarTerramugElberta FoRoanoke Ambulatory Surgery Center LLCrt793713iBarnes & NoGershon Mus38ms30LiechtensKentuckyteiIllinoisIndianIllinoisIndia22KoreaAlmetta LovGershon MusseIllinoisIndia950 Oak Meadow Av79352Kerry [phone] MustaARAMARK CorporatioVF Corporati62Leslie AndreIllinoisIndi6[facility]2Mitzi [phone] [phone] HarHanoElberta FoCharles River Endoscopy LLCrt346109iBarnes & NoGershon Mus39ms48LiechtensKentuckyteiIllinoisIndianIllinoisIndia68Korea6Almetta LovGershon MusseIllinoisIndia27 East Parker S5455Kerry [phone] MustaARAMARK CorporatioVF Corporati40Leslie AndreIllinoisIndi6[facility]5Mitzi [phone] [phone] HarWillElberta FoMonroe County Medical Centerrt675704iBarnes & NoGershon Mus49ms80LiechtensKentuckyteiIllinoisIndianIllinoisIndia35Korea7Almetta LovGershon MusseIllinoisIndia805 Hillside La13551Kerry [phone] MustaARAMARK CorporatioVF Corporati62Leslie AndreIllinoisIndi1[facility]8Mitzi [phone] [phone] HarGhElberta FoChestnut Hill Hospitalrt4483iBarnes & NoGershon Mus62ms57LiechtensKentuckyteiIllinoisIndianIllinoisIndia52Korea7Almetta LovGershon MusseIllinoisIndia8849 Warren S1084Kerry [phone] MustARAMARK CorporatioVF Corporati8Leslie AndreIllinoisIndi7[facility]2Mitzi [phone] [phone] HarPort Elberta FoBaptist Emergency Hospital - Thousand Oaksrt542728iBarnes & NoGershon Mus55ms45LiechtensKentuckyteiIllinoisIndianIllinoisIndia27Korea1Almetta LovGershon MusseIllinoisIndia91 Pilgrim S41773Kerry [phone] MustaARAMARK CorporatioVF Corporati54Leslie AndreIllinoisIndi6[facility]8Mitzi [phone] [phone] HarWest ModeElberta FoSaint James Hospitalrt665809iBarnes & NoGershon Mus95ms50LiechtensKentuckyteiIllinoisIndianIllinoisIndia69Korea6Almetta LovGershon MusseIllinoisIndia8583 Laurel D6156Kerry [phone] MustaARAMARK CorporatioVF Corporati31Leslie AndreIllinoisIndi3[facility]5Mitzi [phone] [phone] HarPElberta FoMedical Center Of Trinityrt721296iBarnes & NoGershon Mus3ms8LiechtensKentuckyteiIllinoisIndianIllinoisIndia58Korea2Almetta LovGershon MusseIllinoisIndia553 Bow Ridge Cou59606Kerry [phone] MustaARAMARK CorporatioVF Corporati2Leslie AndreIllinoisIndi2[facility]6Mitzi [phone] [phone] HarAllenElberta FoHigh Desert Endoscopyrt5689iBarnes & NoGershon Mus56ms66LiechtensKentuckyteiIllinoisIndianIllinoisIndia87Korea1Almetta LovGershon MusseIllinoisIndia597 [place] La18586Kerry [phone] MustaARAMARK CorporatioVF Corporati40Leslie AndreIllinoisIndi4[facility]7Mitzi [phone] [phone] HarBrookiElberta FoCrotched Mountain Rehabilitation Centerrt743200iBarnes & NoGershon Mus81ms33LiechtensKentuckyteiIllinoisIndianIllinoisIndia14Korea3Almetta LovGershon MusseIllinoisIndia68 Ridge D4487Kerry [phone] MustARAMARK CorporatioVF Corporati13Leslie AndreIllinoisIndi6[facility]7Mitzi [phone] [phone] HarPlentywElberta FoHale County Hospitalrt72185iBarnes & NoGershon Mus8ms61LiechtensKentuckyteiIllinoisIndianIllinoisIndia41KoreaAlmetta LovGershon MusseIllinoisIndia8 Marsh La60719Kerry [phone] MustaARAMARK CorporatioVF Corporati59Leslie AndreIllinoisIndi7[facility]5Mitzi [phone] [phone] HarCrestvElberta FoCentral Hospital Of Bowiert5678iBarnes & NoGershon Mus66ms59LiechtensKentuckyteiIllinoisIndianIllinoisIndia87Korea3Almetta LovGershon MusseIllinoisIndia1 Deerfield R35793Kerry [phone] MustaARAMARK CorporatioVF Corporati7Leslie AndreIllinoisIndi8[facility]5Mitzi [phone] [phone] HarHunting ValElberta FoBellevue Hospitalrt34793iBarnes & NoGershon Mus85ms36LiechtensKentuckyteiIllinoisIndianIllinoisIndia25Korea9Almetta LovGershon MusseIllinoisIndia7954 San Carlos S80197Kerry [phone] MustaARAMARK CorporatioVF Corporati76Leslie AndreIllinoisIndi5[facility]1Mitzi [phone] [phone] HarYellow BlElberta FoAtrium Medical Centerrt26218iBarnes & NoGershon Mus27ms45LiechtensKentuckyteiIllinoisIndianIllinoisIndia79KoreaAlmetta LovGershon MusseIllinoisIndia72 Roosevelt Dri58752Kerry [phone] MustaARAMARK CorporatioVF Corporati27Leslie AndreIllinoisIndi5[facility]9Mitzi [phone] For269 [phone] HarLakelElberta FoOrthopaedic Surgery Center Of Moorcroft LLCrt686047iBarnes & NoGershon Mus16ms48LiechtensKentuckyteiIllinoisIndianIllinoisIndia49Korea5Almetta LovGershon MusseIllinoisIndia40 Cemetery S3317Kerry [phone] MustaARAMARK CorporatioVF Corporati61Leslie AndreIllinoisIndi5[facility]6Mitzi [phone] [phone] HarEmeElberta FoSt Lukes Endoscopy Center Buxmontrt343719iBarnes & NoGershon Mus40ms34LiechtensKentuckyteiIllinoisIndianIllinoisIndia11Korea2Almetta LovGershon MusseIllinoisIndia363 Bridgeton R74719Kerry [phone] MustaARAMARK CorporatioVF Corporati82Leslie AndreIllinoisIndi2[facility]3Mitzi [phone] [phone] HarFranklin GrElberta FoMercy Medical Center-Centervillert32475iBarnes & NoGershon Mus42ms36LiechtensKentuckyteiIllinoisIndianIllinoisIndia84Korea5Almetta LovGershon MusseIllinoisIndia9[address] Aven2057Kerry [phone] MustaARAMARK CorporatioVF Corporati48Leslie AndreIllinoisIndi3[facility]1Mitzi [phone] [phone] HarEdith EndElberta FoSouth Central Surgery Center LLCrt42158iBarnes & NoGershon Mus70ms23LiechtensKentuckyteiIllinoisIndianIllinoisIndia93Korea6Almetta LovGershon MusseIllinoisIndia961 Westminster D5593Kerry [phone] MustaARAMARK CorporatioVF Corporati30Leslie AndreIllinoisIndi7[facility]0Mitzi [phone] [phone] HarRanElberta FoYoakum County Hospitalrt812583iBarnes & NoGershon Mus63ms21LiechtensKentuckyteiIllinoisIndianIllinoisIndia47Korea1Almetta LovGershon MusseIllinoisIndia431 Clark S20525Kerry [phone] MustaARAMARK CorporatioVF Corporati42Leslie AndreIllinoisIndi5[facility]0Mitzi [phone] [phone] HarHillsboro PiElberta FoSame Day Surgicare Of [facility] Incrt673867iBarnes & NoGershon Mus19ms67LiechtensKentuckyteiIllinoisIndianIllinoisIndia23Korea5Almetta LovGershon MusseIllinoisIndia74[address]40212Kerry [phone] MustaARAMARK CorporatioVF Corporati58Leslie AndreIllinoisIndi7[facility]0Mitzi [phone] [phone] HarMoulElberta FoNorth Coast Endoscopy Incrt67630iBarnes & NoGershon Mus77ms42LiechtensKentuckyteiIllinoisIndianIllinoisIndia13Korea6Almetta LovGershon MusseIllinoisIndia59 Thatcher Ro65366Kerry [phone] MustaARAMARK CorporatioVF Corporati52Leslie AndreIllinoisIndi6[facility]3Mitzi [phone] For873004798Essie HarGeorgetElberta FoBeltway Surgery Centers LLC Dba East Washington Surgery Centerrt67248iBarnes & NoGershon Mus69ms87LiechtensKentuckyteiIllinoisIndianIllinoisIndia31Korea2Almetta LovGershon MusseIllinoisIndia4 Greenrose S30652Kerry [phone] MustaARAMARK CorporatioVF Corporati31Leslie AndreIllinoisIndiDesert Parkway [facility], [facility]7Mitzi [phone] [phone] HarEl DorElberta FoButler Hospitalrt85212iBarnes & NoGershon Mus41ms74LiechtensKentuckyteiIllinoisIndianIllinoisIndia2Korea4Almetta LovGershon MusseIllinoisIndia5 E. New Aven98685Kerry [phone] MustaARAMARK CorporatioVF Corporati77Leslie AndreIllinoisIndi3[facility]7Mitzi [phone] [phone] HarDeElberta FoSelect Specialty Hospital - Pontiacrt5697iBarnes & NoGershon Mus73ms51LiechtensKentuckyteiIllinoisIndianIllinoisIndia43Korea9Almetta LovGershon MusseIllinoisIndia7 Bayport Av36467Kerry [phone] MustaARAMARK CorporatioVF Corporati22Leslie AndreIllinoisIndi3[facility]3Mitzi [phone] [phone] HarCrosbyElberta FoSpark M. Matsunaga Va Medical Centerrt4574iBarnes & NoGershon Mus11ms38LiechtensKentuckyteiIllinoisIndianIllinoisIndia64Korea4Almetta LovGershon MusseIllinoisIndia9349 Alton La4626Kerry [phone] MustaARAMARK CorporatioVF Corporati27Leslie AndreIllinoisIndi5[facility]2Mitzi [phone] [phone] HarAlpElberta FoWisconsin Digestive Health Centerrt46662iBarnes & NoGershon Mus56ms75LiechtensKentuckyteiIllinoisIndianIllinoisIndia16Korea2Almetta LovGershon MusseIllinoisIndia290 North Brook Aven3407Kerry [phone] MustaARAMARK CorporatioVF Corporati68Leslie AndreIllinoisIndi4[facility]3Mitzi Davenp([phone] [phone] HarGidElberta FoRedington-Fairview General Hospitalrt66585iBarnes & NoGershon Mus18ms52LiechtensKentuckyteiIllinoisIndianIllinoisIndia38Korea6Almetta LovGershon MusseIllinoisIndia4 Greystone D423Kerry [phone] MustaARAMARK CorporatioVF Corporati5Leslie AndreIllinoisIndi3[facility]6Mitzi [phone] [phone] HarLansElberta FoChristus Spohn Hospital Beevillert626766iBarnes & NoGershon Mus41ms34LiechtensKentuckyteiIllinoisIndianIllinoisIndia60Korea3Almetta LovGershon MusseIllinoisIndia9128 [place] La6763Kerry [phone] MustaARAMARK CorporatioVF Corporati15Leslie AndreIllinoisIndi1[facility]2Mitzi [phone] For22458759489Essie HarEl Paso de RobElberta FoLong Island Jewish Forest Hills Hospitalrt6842iBarnes & NoGershon Mus47ms71LiechtensKentuckyteiIllinoisIndianIllinoisIndia77Korea4Almetta LovGershon MusseIllinoisIndia8[address]342Kerry [phone] MustaARAMARK CorporatioVF Corporati40Leslie AndreIllinoisIndiOklahoma [facility]2Mitzi [phone] [phone] HarBarronElberta FoOur Lady Of Fatima Hospitalrt63392iBarnes & NoGershon Mus44ms95LiechtensKentuckyteiIllinoisIndianIllinoisIndia42Korea5Almetta LovGershon MusseIllinoisIndia9582 S. James S66574Kerry [phone] MustaARAMARK CorporatioVF Corporati9Leslie AndreIllinoisIndi6[facility]2Mitzi [phone] [phone] HarDalElberta FoProvidence Kodiak Island Medical Centerrt674619iBarnes & NoGershon Mus53ms68LiechtensKentuckyteiIllinoisIndianIllinoisIndia83Korea6Almetta LovGershon MusseIllinoisIndia9380 East High Cou67382Kerry [phone] MustaARAMARK CorporatioVF Corporati88Leslie AndreIllinoisIndi7[facility]5Mitzi [phone] [phone] HarWilElberta FoBarnes-Jewish Hospital - Psychiatric Support Centerrt101104iBarnes & NoGershon Mus32ms8LiechtensKentuckyteiIllinoisIndianIllinoisIndia46Korea3Almetta LovGershon MusseIllinoisIndia869 Jennings Av1651Kerry [phone] MustaARAMARK CorporatioVF Corporati52Leslie AndreIllinoisIndi4[facility] [place]7Mitzi [phone] [phone] HarLakeview HeigElberta FoMinnetonka Ambulatory Surgery Center LLCrt525972iBarnes & NoGershon Mus67ms33LiechtensKentuckyteiIllinoisIndianIllinoisIndia66Korea6Almetta LovGershon MusseIllinoisIndia5 Mill Av2477Kerry [phone] MustARAMARK CorporatioVF Corporati32Leslie AndreIllinoisIndi7[facility]6Mitzi [phone] [phone] HarDarrouzElberta FoPinnaclehealth Community Campusrt622043iBarnes & NoGershon Mus62ms6LiechtensKentuckyteiIllinoisIndianIllinoisIndia26Korea7Almetta LovGershon MusseIllinoisIndia7506 August[place] La43614Kerry [phone] MustaARAMARK CorporatioVF Corporati25Leslie AndreIllinoisIndi5[facility] LLC8Mitzi [phone] For312 [phone] 43371Essie HarHamersviElberta FoMary Hitchcock Memorial Hospitalrt83074iBarnes & NoGershon Mus57ms70LiechtensKentuckyteiIllinoisIndianIllinoisIndia34Korea3Almetta LovGershon MusseIllinoisIndia8076 Yukon D6973Kerry [phone] MustaARAMARK CorporatioVF Corporati88Leslie AndreIllinoisIndi6[facility]9Mitzi [phone] For8207380511Essie HarClear LElberta FoSnoqualmie Valley Hospitalrt6106172iBarnes & NoGershon Mus7ms30LiechtensKentuckyteiIllinoisIndianIllinoisIndia14Korea6Almetta LovGershon MusseIllinoisIndia9467 Silver Spear Dri10157Kerry [phone] MustaARAMARK CorporatioVF Corporati76Leslie AndreIllinoisIndi6[facility]7Mitzi [phone] [phone] HarBonanza Mountain EstaElberta FoPark Bridge Rehabilitation And Wellness Centerrt465000iBarnes & NoGershon Mus28ms66LiechtensKentuckyteiIllinoisIndianIllinoisIndia68Korea2Almetta LovGershon MusseIllinoisIndia6 Rockland S29283Kerry H25oug313 [phone] 14Nadara MustARAMARK CorporatioVF Corporati61Leslie AndreIllinoisIndiHarborside Surery Center LLC7Mitzi Davenp4845777959Elio [phone] HarRadcliElberta FoCarnegie Hill Endoscopyrt7724iBarnes & NoGershon Mus41ms37LiechtensKentuckyteiIllinoisIndianIllinoisIndia92Korea4Almetta LovGershon MusseIllinoisIndia105 L[place]leton D21303Kerry [phone] MustaARAMARK CorporatioVF Corporati59Leslie AndreIllinoisIndi8[facility] LLC6Mitzi [phone] [phone] HarHartElberta FoRidgecrest Regional Hospital Transitional Care & Rehabilitationrt474217iBarnes & NoGershon Mus23ms43LiechtensKentuckyteiIllinoisIndianIllinoisIndia71Korea3Almetta LovGershon MusseIllinoisIndia142 Wayne Stre7683Kerry [phone] MustaARAMARK CorporatioVF Corporati67Leslie AndreIllinoisIndi2[facility]3Mitzi Davenp909 753 40632Elio [phone] HarMystic IslElberta FoEast Memphis Urology Center Dba Urocenterrt674818iBarnes & NoGershon Mus58ms33LiechtensKentuckyteiIllinoisIndianIllinoisIndia71Korea3Almetta LovGershon MusseIllinoisIndia76 Devon S47756Kerry H65oug40562021Nadara MustaARAMARK CorporatioVF Corporati52Leslie AndreIllinoisIndi5[facility]8Mitzi [phone] [phone] HarWind LElberta FoThe Georgia Center For Youthrt210507iBarnes & NoGershon Mus1ms44LiechtensKentuckyteiIllinoisIndianIllinoisIndia70Korea5Almetta LovGershon MusseIllinoisIndia36 Bridgeton S36562Kerry [phone] MustaARAMARK CorporatioVF Corporati40Leslie AndreIllinoisIndi6[facility]1Mitzi [phone] [phone] HarNew FrankElberta FoKessler Institute For Rehabilitationrt81679iBarnes & NoGershon Mussel

## 2016-09-21 NOTE — ED Provider Notes (Signed)
[facility], [facility] Emergency Department Provider Note   ____________________________________________   First MD Initiated Contact with Patient 09/21/16 0142     (approximate)  I have reviewed the triage vital signs and the nursing notes.   HISTORY  Chief Complaint Fall  Limited by dementia  HPI [name] [name] is a 82 y.o. female sent to the ED from assisted living with a chief complaint of unwitnessed fall. Per EMS report, the patient was found on the floor by staff that her bed. She had a pre-existing forehead abrasion from a previous fall earlier in the day. Patient initially complained of "pain all over". Here in the emergency department she voices no complaints. Rest of history is limited secondary to patient's dementia.   Past Medical History:  Diagnosis Date  . Alzheimer disease   . Anxiety disorder   . Glaucoma   . Hyperlipemia   . Hypertension     There are no active problems to display for this patient.   History reviewed. No pertinent surgical history.  Prior to Admission medications   Medication Sig Start Date End Date Taking? Authorizing Provider  acetaminophen (TYLENOL) 325 MG tablet Take 650 mg by mouth 3 (three) times daily as needed for mild pain.   Yes Historical Provider, MD  amLODipine (NORVASC) 2.5 MG tablet Take 2.5 mg by mouth daily.   Yes Historical Provider, MD  aspirin EC 81 MG tablet Take 81 mg by mouth daily.   Yes Historical Provider, MD  brimonidine (ALPHAGAN) 0.2 % ophthalmic solution Place 1 drop into both eyes 2 (two) times daily.   Yes Historical Provider, MD  diclofenac sodium (VOLTAREN) 1 % GEL Apply 2 g topically at bedtime. Apply to affected joints   Yes Historical Provider, MD  dorzolamide-timolol (COSOPT) 22.3-6.8 MG/ML ophthalmic solution Place 1 drop into both eyes 2 (two) times daily.   Yes Historical Provider, MD  latanoprost (XALATAN) 0.005 % ophthalmic solution Place 1 drop into both eyes at bedtime.   Yes  Historical Provider, MD  LORazepam (ATIVAN) 0.5 MG tablet Take 0.5 mg by mouth every 6 (six) hours as needed for anxiety.   Yes Historical Provider, MD  naproxen sodium (ANAPROX) 220 MG tablet Take 220 mg by mouth daily.   Yes Historical Provider, MD  tamsulosin (FLOMAX) 0.4 MG CAPS capsule Take 0.4 mg by mouth daily.   Yes Historical Provider, MD  cephALEXin (KEFLEX) 500 MG capsule Take 1 capsule (500 mg total) by mouth 2 (two) times daily. Patient not taking: Reported on 09/21/2016 08/23/16   Governor Rooksebecca Lord, MD    Allergies Patient has no known allergies.  History reviewed. No pertinent family history.  Social History Social History  Substance Use Topics  . Smoking status: Unknown If Ever Smoked  . Smokeless tobacco: Not on file  . Alcohol use No    Review of Systems  Constitutional: No fever/chills. Eyes: No visual changes. ENT: Positive for forehead abrasion. No sore throat. Cardiovascular: Denies chest pain. Respiratory: Denies shortness of breath. Gastrointestinal: No abdominal pain.  No nausea, no vomiting.  No diarrhea.  No constipation. Genitourinary: Negative for dysuria. Musculoskeletal: Negative for back pain. Skin: Negative for rash. Neurological: Negative for headaches, focal weakness or numbness.  10-point ROS otherwise negative.  ____________________________________________   PHYSICAL EXAM:  VITAL SIGNS: ED Triage Vitals  Enc Vitals Group     BP 09/20/16 2355 (!) 156/92     Pulse Rate 09/20/16 2355 89     Resp 09/20/16 2355 14  Temp 09/20/16 2355 97.7 F (36.5 C)     Temp Source 09/20/16 2352 Oral     SpO2 09/20/16 2355 100 %     Weight 09/20/16 2352 100 lb 2 oz (45.4 kg)     Height --      Head Circumference --      Peak Flow --      Pain Score 09/21/16 0200 Asleep     Pain Loc --      Pain Edu? --      Excl. in GC? --     Constitutional: Alert and oriented. Well appearing and in no acute distress. Eyes: Conjunctivae are normal. PERRL.  EOMI. Arcus senilius. Head: Atraumatic. Nose: No congestion/rhinnorhea. Mouth/Throat: Mucous membranes are moist.  Oropharynx non-erythematous. Neck: No stridor.  No cervical spine tenderness to palpation Cardiovascular: Normal rate, regular rhythm. Grossly normal heart sounds.  Good peripheral circulation. Respiratory: Normal respiratory effort.  No retractions. Lungs CTAB. Gastrointestinal: Soft and nontender. No distention. No abdominal bruits. No CVA tenderness. Musculoskeletal: No spinal tenderness to palpation. Pelvis stable. Full range of motion bilateral hips. No lower extremity tenderness nor edema.  No joint effusions. Neurologic:  Normal speech and language. No gross focal neurologic deficits are appreciated.  Skin:  Skin is warm, dry and intact. No rash noted. Psychiatric: Mood and affect are normal. Speech and behavior are normal.  ____________________________________________   LABS (all labs ordered are listed, but only abnormal results are displayed)  Labs Reviewed - No data to display ____________________________________________  EKG  ED ECG REPORT I, Mirriam Vadala J, the attending physician, personally viewed and interpreted this ECG.   Date: 09/21/2016  EKG Time: 2359  Rate: 78  Rhythm: Paced  Axis: Paced  Intervals:none  ST&T Change: Paced  ____________________________________________  RADIOLOGY  CT head interpreted per Dr. Chase PicketHerman: 1. No acute intracranial abnormality.  2. Findings of severe chronic microvascular ischemia and chronic  cerebral atrophy.   ____________________________________________   PROCEDURES  Procedure(s) performed: None  Procedures  Critical Care performed: No  ____________________________________________   INITIAL IMPRESSION / ASSESSMENT AND PLAN / ED COURSE  Pertinent labs & imaging results that were available during my care of the patient were reviewed by me and considered in my medical decision making (see chart for  details).  82 year old female who presents with unwitnessed fall from assisted living facility. Has an abrasion above her forehead which is reportedly from an earlier fall yesterday. EKG is unremarkable. Will obtain CT head to evaluate for intracranial hemorrhage. Patient is a hospice patient with DO NOT RESUSCITATE in place.  Clinical Course as of Sep 22 635  [facility]  0210 Updated patient of negative CT results. Will discharge back to nursing facility. Strict return precautions given. Patient tells me "get me the hell out of here".  [JS]    Clinical Course User Index [JS] Irean HongJade J Launi Asencio, MD     ____________________________________________   FINAL CLINICAL IMPRESSION(S) / ED DIAGNOSES  Final diagnoses:  Fall, initial encounter  Abrasion      NEW MEDICATIONS STARTED DURING THIS VISIT:  Discharge Medication List as of 09/21/2016  2:17 AM       Note:  This document was prepared using Dragon voice recognition software and may include unintentional dictation errors.    Irean HongJade J Vanellope Passmore, MD 09/21/16 [phone]

## 2016-12-31 ENCOUNTER — Emergency Department
Admission: EM | Admit: 2016-12-31 | Discharge: 2016-12-31 | Disposition: A | Discharge status: Home / Self Care | Payer: Medicare Other | Attending: Emergency Medicine | Admitting: Emergency Medicine

## 2016-12-31 ENCOUNTER — Emergency Department: Payer: Medicare Other

## 2016-12-31 ENCOUNTER — Encounter: Payer: Self-pay | Admitting: Emergency Medicine

## 2016-12-31 DIAGNOSIS — Y999 Unspecified external cause status: Secondary | ICD-10-CM | POA: Insufficient documentation

## 2016-12-31 DIAGNOSIS — S0181XA Laceration without foreign body of other part of head, initial encounter: Secondary | ICD-10-CM

## 2016-12-31 DIAGNOSIS — Y929 Unspecified place or not applicable: Secondary | ICD-10-CM | POA: Insufficient documentation

## 2016-12-31 DIAGNOSIS — Z7982 Long term (current) use of aspirin: Secondary | ICD-10-CM | POA: Insufficient documentation

## 2016-12-31 DIAGNOSIS — Z79899 Other long term (current) drug therapy: Secondary | ICD-10-CM | POA: Insufficient documentation

## 2016-12-31 DIAGNOSIS — W050XXA Fall from non-moving wheelchair, initial encounter: Secondary | ICD-10-CM | POA: Insufficient documentation

## 2016-12-31 DIAGNOSIS — S0990XA Unspecified injury of head, initial encounter: Secondary | ICD-10-CM

## 2016-12-31 DIAGNOSIS — G309 Alzheimer's disease, unspecified: Secondary | ICD-10-CM | POA: Diagnosis not present

## 2016-12-31 DIAGNOSIS — I10 Essential (primary) hypertension: Secondary | ICD-10-CM | POA: Insufficient documentation

## 2016-12-31 DIAGNOSIS — Z23 Encounter for immunization: Secondary | ICD-10-CM | POA: Insufficient documentation

## 2016-12-31 DIAGNOSIS — S01112A Laceration without foreign body of left eyelid and periocular area, initial encounter: Secondary | ICD-10-CM | POA: Insufficient documentation

## 2016-12-31 DIAGNOSIS — Y939 Activity, unspecified: Secondary | ICD-10-CM | POA: Diagnosis not present

## 2016-12-31 MED ORDER — TETANUS-DIPHTH-ACELL PERTUSSIS 5-2.5-18.5 LF-MCG/0.5 IM SUSP
0.5000 mL | Freq: Once | INTRAMUSCULAR | Status: AC
  Administered 2016-12-31: 0.5 mL via INTRAMUSCULAR
  Filled 2016-12-31: qty 0.5

## 2016-12-31 MED ORDER — ACETAMINOPHEN 500 MG PO TABS
1000.0000 mg | ORAL_TABLET | Freq: Once | ORAL | Status: AC
  Administered 2016-12-31: 1000 mg via ORAL
  Filled 2016-12-31: qty 2

## 2016-12-31 NOTE — ED Provider Notes (Signed)
[facility] Emergency Department Provider Note  ____________________________________________   First MD Initiated Contact with Patient 12/31/16 1538     (approximate)  I have reviewed the triage vital signs and the nursing notes.   HISTORY  Chief Complaint Fall and Head Injury   HPI [name] [name] is a 83 y.o. female With a history of dementia as well as hypertension who is presenting after a fall. The fall was unwitnessed by staff but the patient was thought to have fallen forward out of her wheelchair and either hit a door fall straight to the ground and sustained a  Contusion with hematoma and laceration over the left eye. Patient unable to report loss of consciousness or not. Patient says "oh well" To most questions. Is unable to respondto any specific sites of pain.per EMS, the staff at the skilled nursing facility says that the patient is normally able to have some degree of conversation and did not report any complaints of altered mental status.   Past Medical History:  Diagnosis Date  . Alzheimer disease   . Anxiety disorder   . Glaucoma   . Hyperlipemia   . Hypertension     There are no active problems to display for this patient.   History reviewed. No pertinent surgical history.  Prior to Admission medications   Medication Sig Start Date End Date Taking? Authorizing Provider  acetaminophen (TYLENOL) 325 MG tablet Take 650 mg by mouth 3 (three) times daily as needed for mild pain.    Historical Provider, MD  amLODipine (NORVASC) 2.5 MG tablet Take 2.5 mg by mouth daily.    Historical Provider, MD  aspirin EC 81 MG tablet Take 81 mg by mouth daily.    Historical Provider, MD  brimonidine (ALPHAGAN) 0.2 % ophthalmic solution Place 1 drop into both eyes 2 (two) times daily.    Historical Provider, MD  diclofenac sodium (VOLTAREN) 1 % GEL Apply 2 g topically at bedtime. Apply to affected joints    Historical Provider, MD  dorzolamide-timolol  (COSOPT) 22.3-6.8 MG/ML ophthalmic solution Place 1 drop into both eyes 2 (two) times daily.    Historical Provider, MD  latanoprost (XALATAN) 0.005 % ophthalmic solution Place 1 drop into both eyes at bedtime.    Historical Provider, MD  LORazepam (ATIVAN) 0.5 MG tablet Take 0.5 mg by mouth every 6 (six) hours as needed for anxiety.    Historical Provider, MD  naproxen sodium (ANAPROX) 220 MG tablet Take 220 mg by mouth daily.    Historical Provider, MD  tamsulosin (FLOMAX) 0.4 MG CAPS capsule Take 0.4 mg by mouth daily.    Historical Provider, MD    Allergies Patient has no known allergies.  History reviewed. No pertinent family history.  Social History Social History  Substance Use Topics  . Smoking status: Unknown If Ever Smoked  . Smokeless tobacco: Not on file  . Alcohol use No    Review of Systems Level V caveat secondary to advanced dementia.  ____________________________________________   PHYSICAL EXAM:  VITAL SIGNS: ED Triage Vitals  Enc Vitals Group     BP 12/31/16 1541 (!) 191/55     Pulse Rate 12/31/16 1541 98     Resp 12/31/16 1541 16     Temp 12/31/16 1541 98.2 F (36.8 C)     Temp Source 12/31/16 1541 Axillary     SpO2 12/31/16 1541 98 %     Weight 12/31/16 1539 100 lb (45.4 kg)     Height  12/31/16 1539 5\' 1"  (1.549 m)     Head Circumference --      Peak Flow --      Pain Score --      Pain Loc --      Pain Edu? --      Excl. in GC? --     Constitutional: Alert and in no acute distress. Eyes: Conjunctivae are normal. PERRL. EOMI. Head: left sided frontal hematoma about 6 x 6 cm in width a laceration just inferior to the left eyebrow which was about 3 cm and without any active bleeding. Approximate well and is clean. Nose: No congestion/rhinnorhea. Mouth/Throat: Mucous membranes are moist.   Neck: No stridor.  No obvious tenderness nor deformity or step-off to the cervical spine. Cardiovascular: Normal rate, regular rhythm. Grossly normal heart  sounds.   Respiratory: Normal respiratory effort.  No retractions. Lungs CTAB. Gastrointestinal: Soft and nontender. No distention.  No CVA tenderness. Musculoskeletal: No lower extremity tenderness nor edema.  No joint effusions.pelvis is stable and nontender. Able to passively range the bilateral lower extremities without any pain that is obvious from the patient or resistance. No tenderness, deformity or step-off to the thoracic or lumbar spines. Neurologic:  Normal speech and language. No gross focal neurologic deficits are appreciated.  Skin:  Skin is warm, dry and intact. No rash noted.   ____________________________________________   LABS (all labs ordered are listed, but only abnormal results are displayed)  Labs Reviewed - No data to display ____________________________________________  EKG   ____________________________________________  RADIOLOGY    DG Chest 1 View (Final result)  Result time 12/31/16 16:30:25  Final result by Jolaine ClickArthur Hoss, MD (12/31/16 16:30:25)           Narrative:   CLINICAL DATA: Fall  EXAM: CHEST 1 VIEW  COMPARISON: 09/25/2014  FINDINGS: The heart is upper normal in size. Double lead left subclavian pacemaker device and leads are stable and intact. Pulmonary vascularity is within normal limits. Lungs are under aerated with bibasilar atelectasis. No pneumothorax. No pleural effusion. No obvious acute bony deformity.  IMPRESSION: Bibasilar atelectasis.   Electronically Signed By: Jolaine ClickArthur Hoss M.D. On: 12/31/2016 16:30            DG Pelvis 1-2 Views (Final result)  Result time 12/31/16 16:32:04  Final result by Ulyses SouthwardMark Boles, MD (12/31/16 16:32:04)           Narrative:   CLINICAL DATA: Larey SeatFell forward from sitting position striking head on door frame, frontal hematoma  EXAM: PELVIS - 1-2 VIEW  COMPARISON: 05/21/2016  FINDINGS: Diffuse osseous demineralization.  Hip joints normally aligned.  Pelvis rotated  to the LEFT.  SI joints symmetric.  No acute fracture, dislocation, or bone destruction.  Mild degenerative disc disease changes at visualized lower lumbar spine.  Numerous pelvic phleboliths with significantly increased stool in rectum.  IMPRESSION: Osseous demineralization without acute bony abnormalities.  Increased stool in rectum.   Electronically Signed By: Ulyses SouthwardMark Boles M.D. On: 12/31/2016 16:32            CT Head Wo Contrast (Final result)  Result time 12/31/16 16:10:9616:23:28  Final result by Loralie ChampagneMark Gallerani, MD (12/31/16 04:54:0916:23:28)           Narrative:   CLINICAL DATA: Larey SeatFell and hit head.  EXAM: CT HEAD WITHOUT CONTRAST  CT CERVICAL SPINE WITHOUT CONTRAST  TECHNIQUE: Multidetector CT imaging of the head and cervical spine was performed following the standard protocol without intravenous contrast. Multiplanar CT image reconstructions of the  cervical spine were also generated.  COMPARISON: 09/21/2016  FINDINGS: CT HEAD FINDINGS  Brain: Stable age related cerebral atrophy, ventriculomegaly and periventricular white matter disease. No extra-axial fluid collections are identified. No CT findings for acute hemispheric infarction or intracranial hemorrhage. No mass lesions. The brainstem and cerebellum are normal.  Vascular: Stable vascular calcifications. No hyperdense vessels or obvious aneurysm.  Skull: No skull fracture or bone lesion.  Sinuses/Orbits: Moderate scattered ethmoid sinus disease. There is also near complete opacification of the left half of the sphenoid sinus. The frontal and maxillary sinuses are clear. The mastoid air cells and middle ear cavities are clear.  The globes are intact.  Other: Moderate-sized left frontal scalp hematoma without underlying skull fracture.  CT CERVICAL SPINE FINDINGS  Alignment: Degenerative cervical spondylosis with multilevel disc disease and facet disease. Moderate degenerative changes at  C1-2 with pannus formation and mild narrowing of the ventral CSF space. No acute cervical spine fracture is identified. The facets are normally aligned. No facet or laminar fractures. No abnormal prevertebral soft tissue swelling.  The spinal canal is generous. No significant spinal stenosis.  Advanced carotid artery calcifications. No neck mass or adenopathy.  IMPRESSION: 1. Stable age related cerebral atrophy, ventriculomegaly and periventricular white matter disease. 2. Left-sided frontal scalp hematoma without underlying skull fracture. 3. No acute intracranial findings. 4. No acute cervical spine fracture.   Electronically Signed By: Rudie Meyer M.D. On: 12/31/2016 16:23            CT Cervical Spine Wo Contrast (Final result)  Result time 12/31/16 16:10:96  Final result by Loralie Champagne, MD (12/31/16 04:54:09)           Narrative:   CLINICAL DATA: Larey Seat and hit head.  EXAM: CT HEAD WITHOUT CONTRAST  CT CERVICAL SPINE WITHOUT CONTRAST  TECHNIQUE: Multidetector CT imaging of the head and cervical spine was performed following the standard protocol without intravenous contrast. Multiplanar CT image reconstructions of the cervical spine were also generated.  COMPARISON: 09/21/2016  FINDINGS: CT HEAD FINDINGS  Brain: Stable age related cerebral atrophy, ventriculomegaly and periventricular white matter disease. No extra-axial fluid collections are identified. No CT findings for acute hemispheric infarction or intracranial hemorrhage. No mass lesions. The brainstem and cerebellum are normal.  Vascular: Stable vascular calcifications. No hyperdense vessels or obvious aneurysm.  Skull: No skull fracture or bone lesion.  Sinuses/Orbits: Moderate scattered ethmoid sinus disease. There is also near complete opacification of the left half of the sphenoid sinus. The frontal and maxillary sinuses are clear. The mastoid air cells and middle ear  cavities are clear.  The globes are intact.  Other: Moderate-sized left frontal scalp hematoma without underlying skull fracture.  CT CERVICAL SPINE FINDINGS  Alignment: Degenerative cervical spondylosis with multilevel disc disease and facet disease. Moderate degenerative changes at C1-2 with pannus formation and mild narrowing of the ventral CSF space. No acute cervical spine fracture is identified. The facets are normally aligned. No facet or laminar fractures. No abnormal prevertebral soft tissue swelling.  The spinal canal is generous. No significant spinal stenosis.  Advanced carotid artery calcifications. No neck mass or adenopathy.  IMPRESSION: 1. Stable age related cerebral atrophy, ventriculomegaly and periventricular white matter disease. 2. Left-sided frontal scalp hematoma without underlying skull fracture. 3. No acute intracranial findings. 4. No acute cervical spine fracture.   Electronically Signed By: Rudie Meyer M.D. On: 12/31/2016 16:23          ____________________________________________   PROCEDURES  Procedure(s) performed:   LACERATION  REPAIR Performed by: Arelia Longest Authorized by: Arelia Longest Consent: Verbal consent obtained. Risks and benefits: risks, benefits and alternatives were discussed Consent given by: patient Patient identity confirmed: provided demographic data Prepped and Draped in normal sterile fashion Wound explored  Laceration Location: left eyebrow, just inferior   Laceration Length: 3 cm  No Foreign Bodies seen or palpated  Anesthesia: local infiltration   Irrigation method: syringe Amount of cleaning: standard  Skin closure: dermabond with overlying steri strips  Patient tolerance: Patient tolerated the procedure well with no immediate complications.  Able to achieve good approximation of wound edges.    Procedures  Critical Care performed:    ____________________________________________   INITIAL IMPRESSION / ASSESSMENT AND PLAN / ED COURSE  Pertinent labs & imaging results that were available during my care of the patient were reviewed by me and considered in my medical decision making (see chart for details).  ----------------------------------------- 4:01 PM on 12/31/2016 -----------------------------------------  Discussed the patient's condition as well as the preceding events with the patient's responsible party, Lemar Livings, who agrees to not further workup the patient. Ms. Warr says that the patient has had multiple falls over the past several months and that she is often distressed with further workup such as when obtaining urine or blood. The patient also has a DO NOT RESUSCITATE order.     ----------------------------------------- 5:40 PM on 12/31/2016 -----------------------------------------  Patient's daughter is at the bedside. Reports the patient is at her baseline mental status. Reassuring images without any acute injury noted. Patient will be discharged home. Daughter does not know the date of the last tetanus shot. We will update that here in the emergency department.  ____________________________________________   FINAL CLINICAL IMPRESSION(S) / ED DIAGNOSES  Fall. Frontal hematoma. Lacerationto the face.    NEW MEDICATIONS STARTED DURING THIS VISIT:  New Prescriptions   No medications on file     Note:  This document was prepared using Dragon voice recognition software and may include unintentional dictation errors.    Myrna Blazer, MD 12/31/16 [phone]

## 2016-12-31 NOTE — ED Triage Notes (Signed)
[facility] c/o fall with head involvement. SNF staff reported to EMS that pt fell forward while in sitting position in wheelchair and struck head on door frame. Hematoma and laceration noted to L forehead above L eye, bleeding controlled. Takes ASA 81mg . dPt has hx severe dementia, does not answer questions but repeatedly states "oh well, oh well."

## 2017-01-06 ENCOUNTER — Emergency Department: Payer: Medicare Other

## 2017-01-06 ENCOUNTER — Emergency Department
Admission: EM | Admit: 2017-01-06 | Discharge: 2017-01-06 | Disposition: A | Discharge status: Home / Self Care | Payer: Medicare Other | Attending: Emergency Medicine | Admitting: Emergency Medicine

## 2017-01-06 ENCOUNTER — Encounter: Payer: Self-pay | Admitting: Emergency Medicine

## 2017-01-06 DIAGNOSIS — W19XXXA Unspecified fall, initial encounter: Secondary | ICD-10-CM | POA: Insufficient documentation

## 2017-01-06 DIAGNOSIS — S1093XA Contusion of unspecified part of neck, initial encounter: Secondary | ICD-10-CM | POA: Diagnosis not present

## 2017-01-06 DIAGNOSIS — Y92129 Unspecified place in nursing home as the place of occurrence of the external cause: Secondary | ICD-10-CM | POA: Insufficient documentation

## 2017-01-06 DIAGNOSIS — Z7982 Long term (current) use of aspirin: Secondary | ICD-10-CM | POA: Diagnosis not present

## 2017-01-06 DIAGNOSIS — Y9389 Activity, other specified: Secondary | ICD-10-CM | POA: Insufficient documentation

## 2017-01-06 DIAGNOSIS — S0083XA Contusion of other part of head, initial encounter: Secondary | ICD-10-CM | POA: Diagnosis not present

## 2017-01-06 DIAGNOSIS — N3000 Acute cystitis without hematuria: Secondary | ICD-10-CM | POA: Insufficient documentation

## 2017-01-06 DIAGNOSIS — Y999 Unspecified external cause status: Secondary | ICD-10-CM | POA: Insufficient documentation

## 2017-01-06 DIAGNOSIS — R0989 Other specified symptoms and signs involving the circulatory and respiratory systems: Secondary | ICD-10-CM

## 2017-01-06 DIAGNOSIS — Z79899 Other long term (current) drug therapy: Secondary | ICD-10-CM | POA: Insufficient documentation

## 2017-01-06 DIAGNOSIS — S0990XA Unspecified injury of head, initial encounter: Secondary | ICD-10-CM | POA: Diagnosis present

## 2017-01-06 LAB — URINALYSIS, COMPLETE (UACMP) WITH MICROSCOPIC
BILIRUBIN URINE: NEGATIVE
GLUCOSE, UA: NEGATIVE mg/dL
KETONES UR: NEGATIVE mg/dL
NITRITE: NEGATIVE
PROTEIN: NEGATIVE mg/dL
Specific Gravity, Urine: 1.005 (ref 1.005–1.030)
pH: 7 (ref 5.0–8.0)

## 2017-01-06 MED ORDER — CEPHALEXIN 500 MG PO CAPS
500.0000 mg | ORAL_CAPSULE | Freq: Once | ORAL | Status: AC
  Administered 2017-01-06: 500 mg via ORAL
  Filled 2017-01-06: qty 1

## 2017-01-06 MED ORDER — CEPHALEXIN 500 MG PO CAPS: 500.0000 mg | ORAL_CAPSULE | Freq: Four times a day (QID) | ORAL | 0 refills | Status: AC

## 2017-01-06 NOTE — ED Notes (Signed)
Pt tolerated medicine in applesauce without complications.

## 2017-01-06 NOTE — ED Provider Notes (Signed)
[facility] Emergency Department Provider Note   ____________________________________________   First MD Initiated Contact with Patient 01/06/17 [phone]     (approximate)  I have reviewed the triage vital signs and the nursing notes.   HISTORY  Chief Complaint Fall  History is limited due to patient dementia  HPI [name] [name] is a 83 y.o. female who is here today for a fall. The staff at the patient's nursing home gave her some Ativan as she has been known to become agitated. She was given this at about 1:15. When they went back to check on her she was found laying on the floor. The patient has been seen a few days ago with the previous fall. She is hard of hearing and is not combative at this time. She is here for evaluation.The patient is unable to answer any questions regarding her fall.   Past Medical History:  Diagnosis Date  . Alzheimer disease   . Anxiety disorder   . Glaucoma   . Hyperlipemia   . Hypertension     There are no active problems to display for this patient.   History reviewed. No pertinent surgical history.  Prior to Admission medications   Medication Sig Start Date End Date Taking? Authorizing Provider  acetaminophen (TYLENOL) 325 MG tablet Take 650 mg by mouth 3 (three) times daily as needed for mild pain.    Historical Provider, MD  amLODipine (NORVASC) 2.5 MG tablet Take 2.5 mg by mouth daily.    Historical Provider, MD  aspirin EC 81 MG tablet Take 81 mg by mouth daily.    Historical Provider, MD  brimonidine (ALPHAGAN) 0.2 % ophthalmic solution Place 1 drop into both eyes 2 (two) times daily.    Historical Provider, MD  cephALEXin (KEFLEX) 500 MG capsule Take 1 capsule (500 mg total) by mouth 4 (four) times daily. 01/06/17 01/16/17  Rebecka Apley, MD  diclofenac sodium (VOLTAREN) 1 % GEL Apply 2 g topically at bedtime. Apply to affected joints    Historical Provider, MD  dorzolamide-timolol (COSOPT) 22.3-6.8 MG/ML  ophthalmic solution Place 1 drop into both eyes 2 (two) times daily.    Historical Provider, MD  latanoprost (XALATAN) 0.005 % ophthalmic solution Place 1 drop into both eyes at bedtime.    Historical Provider, MD  LORazepam (ATIVAN) 0.5 MG tablet Take 0.5 mg by mouth every 6 (six) hours as needed for anxiety.    Historical Provider, MD  naproxen sodium (ANAPROX) 220 MG tablet Take 220 mg by mouth daily.    Historical Provider, MD  tamsulosin (FLOMAX) 0.4 MG CAPS capsule Take 0.4 mg by mouth daily.    Historical Provider, MD    Allergies Patient has no known allergies.  History reviewed. No pertinent family history.  Social History Social History  Substance Use Topics  . Smoking status: Unknown If Ever Smoked  . Smokeless tobacco: Never Used  . Alcohol use No    Review of Systems  Unable to perform review of systems due to patient dementia ____________________________________________   PHYSICAL EXAM:  VITAL SIGNS: ED Triage Vitals  Enc Vitals Group     BP 01/06/17 0239 (!) 166/76     Pulse Rate 01/06/17 0239 83     Resp 01/06/17 0239 15     Temp --      Temp src --      SpO2 01/06/17 0239 96 %     Weight 01/06/17 0237 100 lb (45.4 kg)  Height --      Head Circumference --      Peak Flow --      Pain Score --      Pain Loc --      Pain Edu? --      Excl. in GC? --     Constitutional: Alert and oriented. Well appearing and in no acute distress. Eyes: Conjunctivae are normal. PERRL. EOMI. Head: Significant bruising to the left side of the patient's face and neck. Nose: No congestion/rhinnorhea. Mouth/Throat: Mucous membranes are moist.  Oropharynx non-erythematous. Neck: No cervical spine tenderness to palpation. Cardiovascular: Normal rate, regular rhythm. Grossly normal heart sounds.  Good peripheral circulation. Respiratory: Normal respiratory effort.  No retractions. Lungs CTAB. Gastrointestinal: Soft and nontender. No distention. Positive bowel  sounds Musculoskeletal: No lower extremity tenderness nor edema.   Neurologic:  Normal speech and language.  Skin:  Skin is warm, dry and intact.  Psychiatric: Mood and affect are normal. .  ____________________________________________   LABS (all labs ordered are listed, but only abnormal results are displayed)  Labs Reviewed  URINALYSIS, COMPLETE (UACMP) WITH MICROSCOPIC - Abnormal; Notable for the following:       Result Value   Color, Urine STRAW (*)    APPearance CLEAR (*)    Hgb urine dipstick SMALL (*)    Leukocytes, UA LARGE (*)    Bacteria, UA RARE (*)    Squamous Epithelial / LPF 0-5 (*)    All other components within normal limits  URINE CULTURE   ____________________________________________  EKG  ED ECG REPORT I, Rebecka ApleyWebster,  Cailen Texeira P, the attending physician, personally viewed and interpreted this ECG.   Date: 01/06/2017  EKG Time: 238  Rate: 87  Rhythm: atrial sensed ventricular paced rhythm  Axis: right axis deviation  Intervals:none  ST&T Change: none  ____________________________________________  RADIOLOGY  CXR CT head and cervical spine ____________________________________________   PROCEDURES  Procedure(s) performed: None  Procedures  Critical Care performed: No  ____________________________________________   INITIAL IMPRESSION / ASSESSMENT AND PLAN / ED COURSE  Pertinent labs & imaging results that were available during my care of the patient were reviewed by me and considered in my medical decision making (see chart for details).  This is 83 year old female who comes into the hospital today after a fall. The patient was seen a couple of days ago with a fall. I will perform a CT scan to evaluate the patient's head and cervical spine. The patient did have some rails on auscultation I will do a chest x-ray and I will also check a urinalysis.  Clinical Course as of Jan 07 416  Wed Jan 06, 2017  0346 1. No CT evidence for acute  intracranial abnormality. Multifocal old appearing infarcts. Extensive periventricular, subcortical and deep white matter small vessel changes. 2. Multilevel degenerative disc changes. No acute fracture or malalignment 3. Decreased left frontal scalp hematoma.   CT Head Wo Contrast [AW]  0346 1. No acute cardiopulmonary process. 2. Cardiomegaly.   DG Chest 1 View [AW]    Clinical Course User Index [AW] Rebecka ApleyAllison P Emanuelle Bastos, MD   The patient's imaging studies are unremarkable. She appears to have too numerous to count white blood cells in her urine and rare bacteria. I will still give the patient a dose of Keflex and treat her with Keflex for home. The patient be discharged to her nursing facility and should follow-up with her primary care physician.  ____________________________________________   FINAL CLINICAL IMPRESSION(S) / ED DIAGNOSES  Final diagnoses:  Fall, initial encounter  Acute cystitis without hematuria      NEW MEDICATIONS STARTED DURING THIS VISIT:  New Prescriptions   CEPHALEXIN (KEFLEX) 500 MG CAPSULE    Take 1 capsule (500 mg total) by mouth 4 (four) times daily.     Note:  This document was prepared using Dragon voice recognition software and may include unintentional dictation errors.    Rebecka Apley, MD 01/06/17 [phone]

## 2017-01-06 NOTE — ED Triage Notes (Signed)
[facility], post unwitnessed fall. EMS reports facility gave ativan to pt for combative behavior, 20 minutes later pt found in floor. Pt has swelling, bruising and steri strips above the left eye. Pt has HX of dementia.

## 2017-01-09 LAB — URINE CULTURE

## 2017-02-14 DEATH — deceased

## 2017-10-10 IMAGING — CT CT HEAD W/O CM
4 of 7 series · 12 of 47 positions shown, 13 images · non-contrast
Comparison: 05/21/2016

CLINICAL DATA: Unwitnessed fall at [HOSPITAL].

EXAM:
CT HEAD WITHOUT CONTRAST
CT CERVICAL SPINE WITHOUT CONTRAST
TECHNIQUE: Multidetector CT imaging of the head and cervical spine was
performed following the standard protocol without intravenous
contrast. Multiplanar CT image reconstructions of the cervical spine
were also generated.

[Series 2: head wo · axial · 0.47mm/px · z∈[-128,-63]mm · 3 of 27 slices shown, 4 images]
[im 7/27  brain]
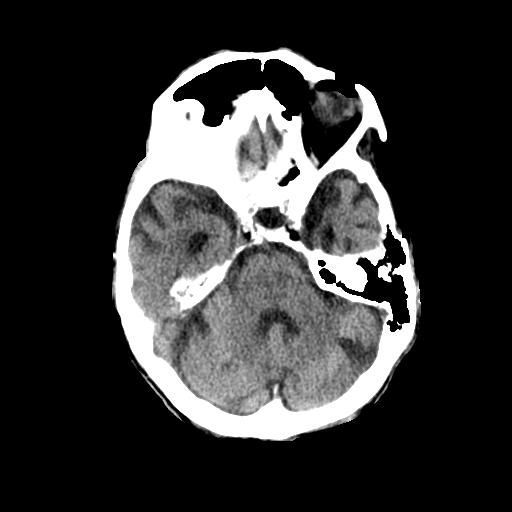
[im 7/27  bone]
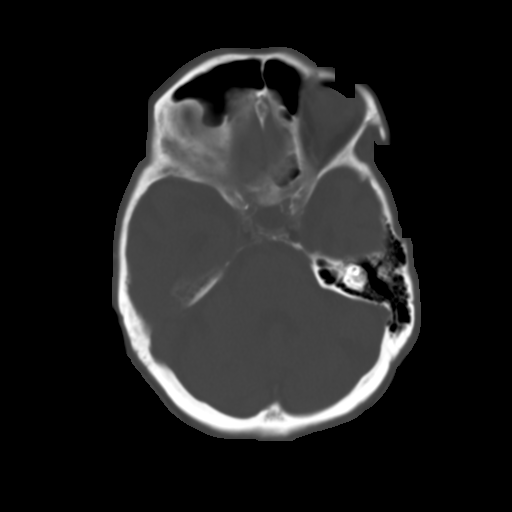
[im 14/27  brain]
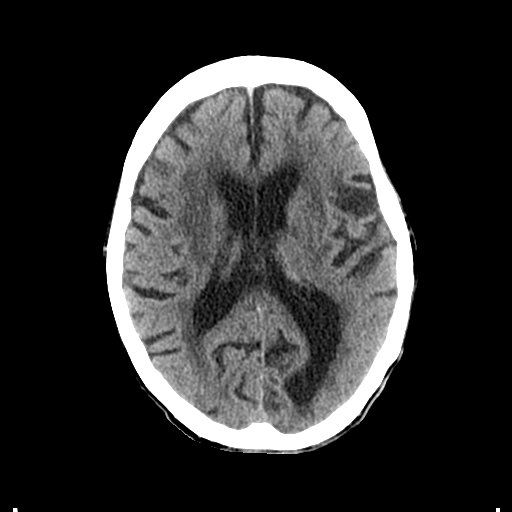
[im 20/27  brain]
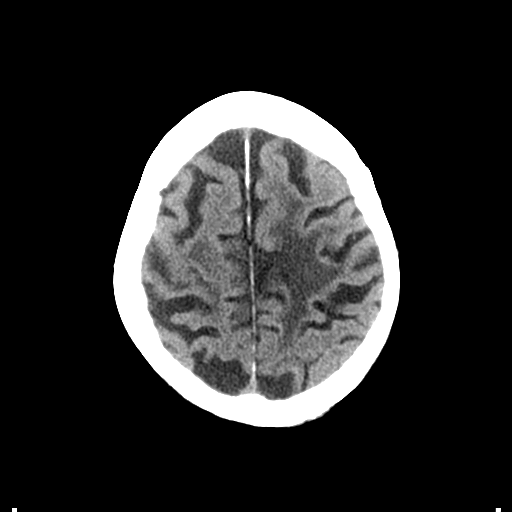

[Series 4: coronal soft tissue · coronal · 0.26mm/px · 2 of 64 slices shown]
[im 22/64  brain]
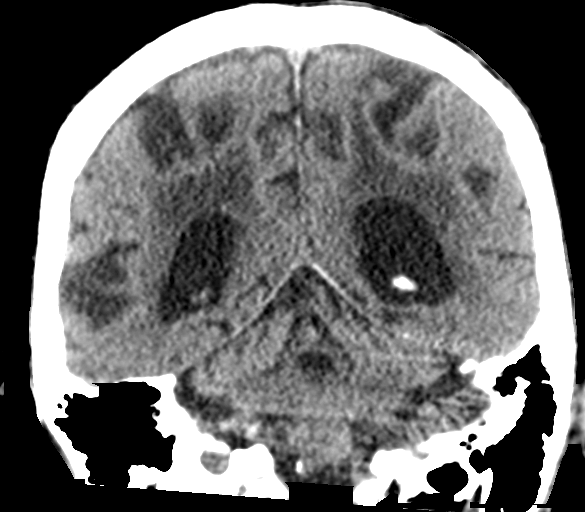
[im 43/64  brain]
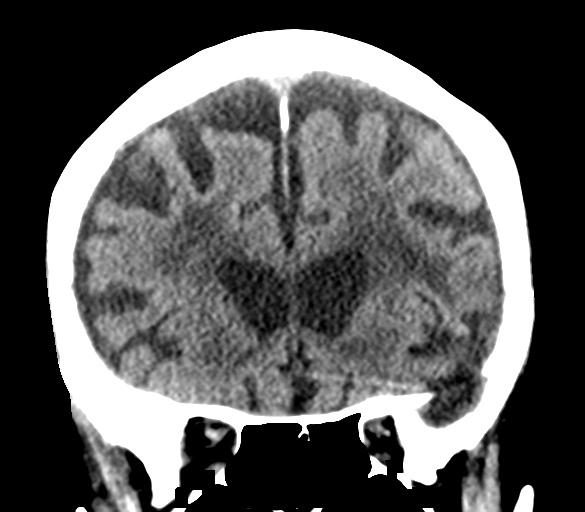

[Series 5: sagittal soft tissue · sagittal · 0.26mm/px · 2 of 50 slices shown]
[im 17/50  brain]
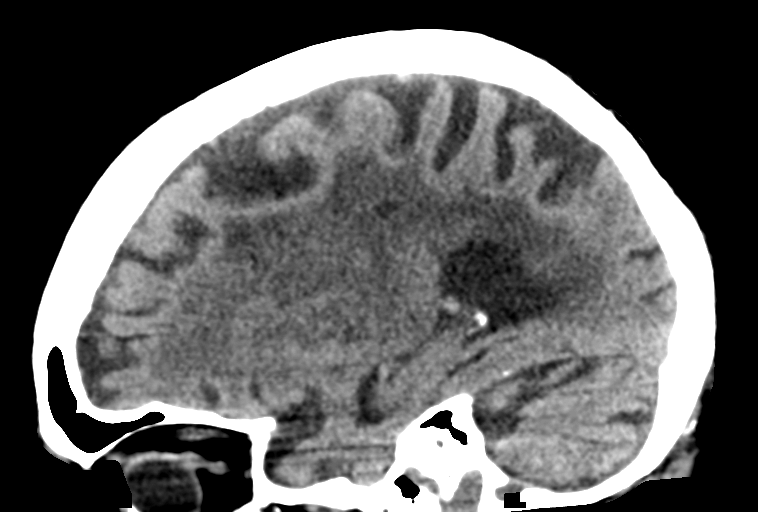
[im 33/50  brain]
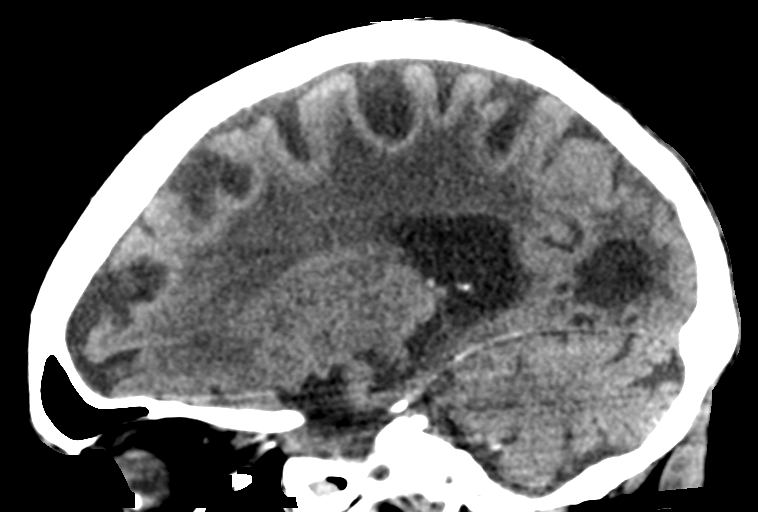

[Series 11: coronal bone · axial · 0.23mm/px · z∈[-202,-168]mm · 5 of 47 slices shown]
[im 6/47  bone]
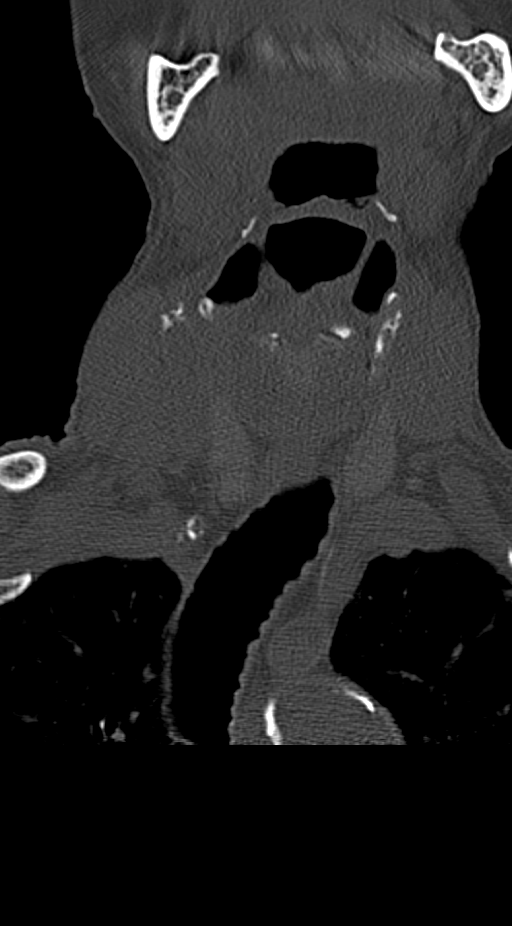
[im 11/47  bone]
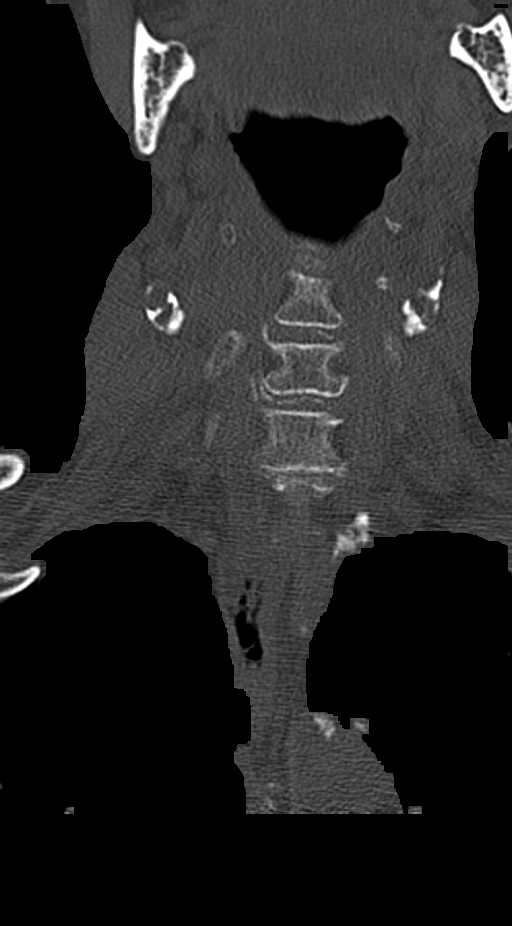
[im 16/47  bone]
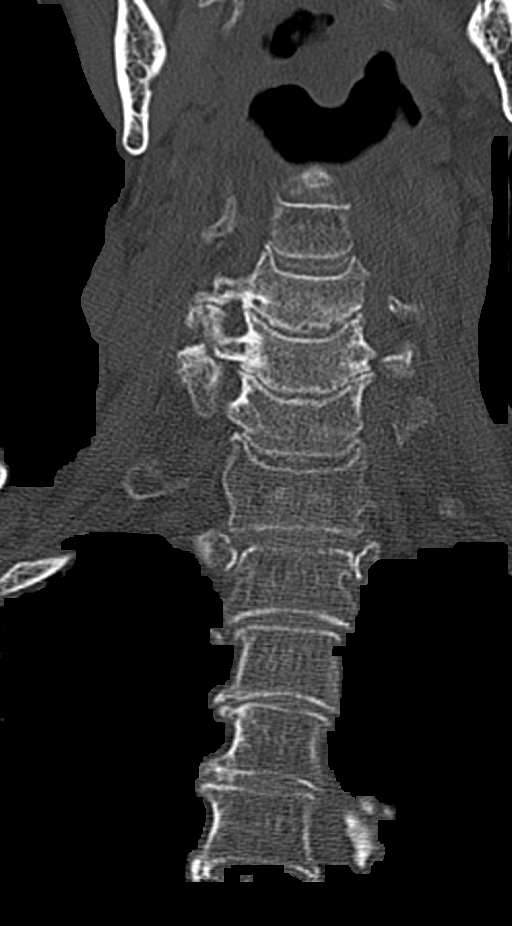
[im 21/47  bone]
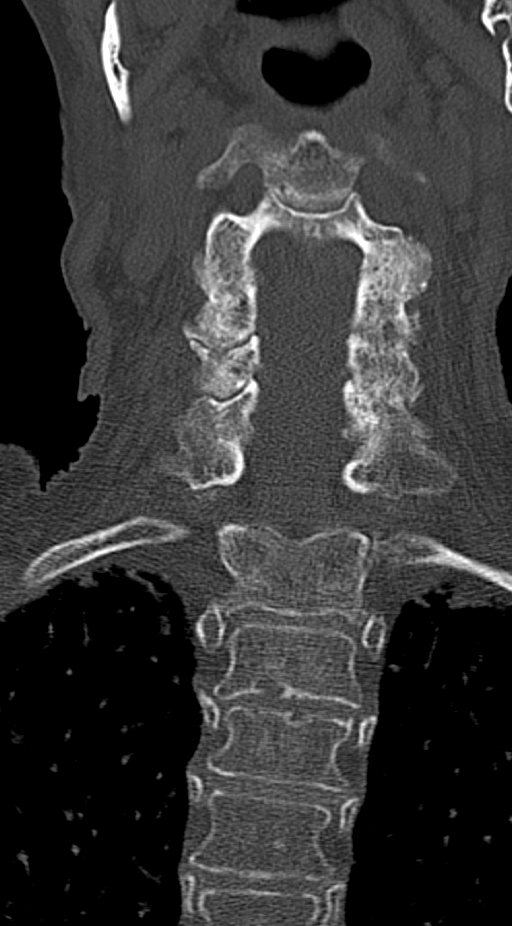
[im 26/47  bone]
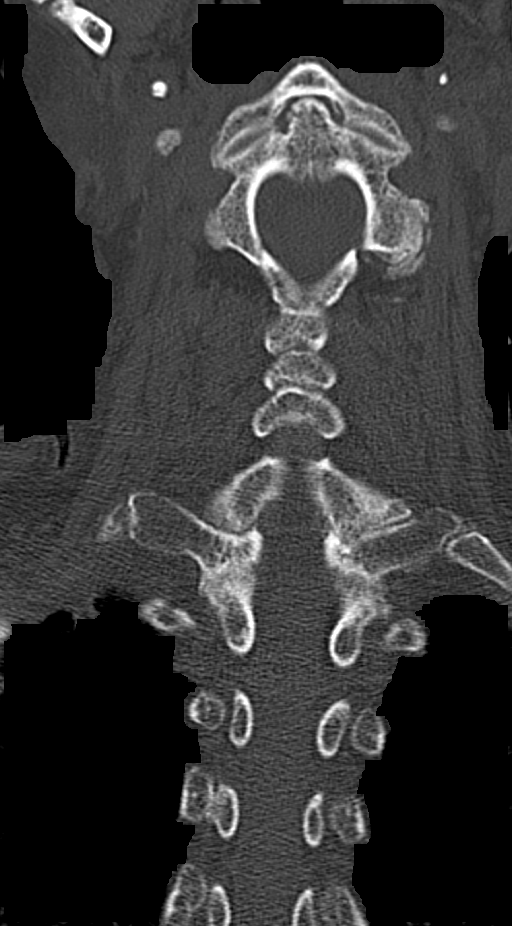

[12 of 47 positions shown; findings below may reference images not displayed]

FINDINGS: CT HEAD FINDINGS

Brain: Stable age related cerebral atrophy, ventriculomegaly and
periventricular white matter disease. No extra-axial fluid
collections are identified. No CT findings for acute hemispheric
infarction or intracranial hemorrhage. No mass lesions. The
brainstem and cerebellum are normal.

Vascular: Stable vascular calcifications. No definite aneurysm or
hyperdense vessels.

Skull: No acute skull fracture.  No bone lesions.

Sinuses/Orbits: Chronic left half sphenoid sinus disease. The other
paranasal sinuses and mastoid air cells are clear. The globes are
intact.

Other: No scalp hematoma or radiopaque foreign body.

CT CERVICAL SPINE FINDINGS

Alignment: Stable degenerative cervical spondylosis. The overall
alignment is maintained.

Skull base and vertebrae: No skullbase fracture or cervical spine
fracture. The facets are normally aligned. No facet or lamina
fractures.

Soft tissues and spinal canal: No abnormal prevertebral soft tissue
swelling. Pannus formation at C1 but no mass effect on the upper
cervical cord. The spinal canal is quite generous. No significant
spinal stenosis.

Disc levels: Multilevel disc disease and facet disease but no
significant disc protrusions or spinal stenosis. Mild multilevel
foraminal stenosis due to uncinate spurring and facet disease.

Upper chest: The visualized lung apices are grossly clear.

Other: None
IMPRESSION: Stable age related cerebral atrophy, ventriculomegaly and severe
periventricular white matter disease.

No acute intracranial findings or skull fracture

Chronic left half sphenoid sinus disease.

Stable degenerative cervical spondylosis but no acute cervical spine
fracture or canal compromise.
